# Patient Record
Sex: Female | Born: 1945 | Race: White | Hispanic: No | Marital: Married | Smoking: Never smoker
Health system: Southern US, Community
[De-identification: ages and names within clinical notes are randomized; demographics above are authoritative.]

## PROBLEM LIST (undated history)

## (undated) DIAGNOSIS — I10 Essential (primary) hypertension: Secondary | ICD-10-CM

## (undated) DIAGNOSIS — M8580 Other specified disorders of bone density and structure, unspecified site: Secondary | ICD-10-CM

## (undated) HISTORY — DX: Other specified disorders of bone density and structure, unspecified site: M85.80

---

## 1999-09-20 ENCOUNTER — Other Ambulatory Visit: Admission: RE | Admit: 1999-09-20 | Discharge: 1999-09-20 | Payer: Self-pay | Admitting: Obstetrics & Gynecology

## 2000-11-24 ENCOUNTER — Other Ambulatory Visit: Admission: RE | Admit: 2000-11-24 | Discharge: 2000-11-24 | Payer: Self-pay | Admitting: Obstetrics & Gynecology

## 2003-01-24 ENCOUNTER — Other Ambulatory Visit: Admission: RE | Admit: 2003-01-24 | Discharge: 2003-01-24 | Payer: Self-pay | Admitting: Obstetrics & Gynecology

## 2004-04-26 ENCOUNTER — Other Ambulatory Visit: Admission: RE | Admit: 2004-04-26 | Discharge: 2004-04-26 | Payer: Self-pay | Admitting: Obstetrics & Gynecology

## 2005-09-07 ENCOUNTER — Other Ambulatory Visit: Admission: RE | Admit: 2005-09-07 | Discharge: 2005-09-07 | Payer: Self-pay | Admitting: Obstetrics & Gynecology

## 2010-08-10 ENCOUNTER — Other Ambulatory Visit: Payer: Self-pay | Admitting: Obstetrics & Gynecology

## 2010-08-10 DIAGNOSIS — Z780 Asymptomatic menopausal state: Secondary | ICD-10-CM

## 2010-08-24 ENCOUNTER — Ambulatory Visit
Admission: RE | Admit: 2010-08-24 | Discharge: 2010-08-24 | Disposition: A | Discharge status: Home / Self Care | Payer: Medicare Other | Source: Ambulatory Visit | Attending: Obstetrics & Gynecology | Admitting: Obstetrics & Gynecology

## 2010-08-24 DIAGNOSIS — Z780 Asymptomatic menopausal state: Secondary | ICD-10-CM

## 2011-08-17 ENCOUNTER — Ambulatory Visit (INDEPENDENT_AMBULATORY_CARE_PROVIDER_SITE_OTHER): Payer: Medicare Other | Admitting: Physician Assistant

## 2011-08-17 VITALS — BP 148/94 | HR 64 | Temp 98.2°F | Resp 18 | Ht 65.5 in | Wt 151.0 lb

## 2011-08-17 DIAGNOSIS — Z111 Encounter for screening for respiratory tuberculosis: Secondary | ICD-10-CM

## 2011-08-17 DIAGNOSIS — J019 Acute sinusitis, unspecified: Secondary | ICD-10-CM

## 2011-08-17 DIAGNOSIS — M8580 Other specified disorders of bone density and structure, unspecified site: Secondary | ICD-10-CM | POA: Insufficient documentation

## 2011-08-17 DIAGNOSIS — R030 Elevated blood-pressure reading, without diagnosis of hypertension: Secondary | ICD-10-CM

## 2011-08-17 DIAGNOSIS — R05 Cough: Secondary | ICD-10-CM

## 2011-08-17 MED ORDER — CEFDINIR 300 MG PO CAPS: 600.0000 mg | ORAL_CAPSULE | Freq: Every day | ORAL | Status: AC

## 2011-08-17 MED ORDER — IPRATROPIUM BROMIDE 0.03 % NA SOLN: 2.0000 | Freq: Two times a day (BID) | NASAL | Status: DC

## 2011-08-17 NOTE — Patient Instructions (Signed)
Take medications as prescribed.   Drink plenty of liquids and get more rest, as you are able. Continue guaifenisen (Mucinex) and use ibuprofen &/or tylenol as needed.  Monitor your blood pressure at home weekly.  If it is consistently >140/90, return for re-evaluation and consideration of medication to lower it.  Return in 48-72 hours for the skin test reading.

## 2011-08-17 NOTE — Progress Notes (Signed)
  Subjective:    Patient ID: Michelle Jacobson, female    DOB: 08-14-1945, 66 y.o.   MRN: [id number]  URI  This is a new problem. The current episode started more than 1 month ago. The problem has been unchanged. There has been no fever. Associated symptoms include congestion, coughing, headaches, rhinorrhea, sinus pain, sneezing and a sore throat. Pertinent negatives include no abdominal pain, chest pain, diarrhea, dysuria, ear pain, joint pain, joint swelling, nausea, neck pain, plugged ear sensation, rash, swollen glands, vomiting or wheezing. She has tried acetaminophen, antihistamine, decongestant and NSAIDs (and neti pot, saline nasal spray) for the symptoms. The treatment provided moderate (relief is temporary) relief.      Review of Systems  HENT: Positive for congestion, sore throat, rhinorrhea and sneezing. Negative for ear pain and neck pain.   Respiratory: Positive for cough. Negative for wheezing.   Cardiovascular: Negative for chest pain.  Gastrointestinal: Negative for nausea, vomiting, abdominal pain and diarrhea.  Genitourinary: Negative for dysuria.  Musculoskeletal: Negative for joint pain.  Skin: Negative for rash.  Neurological: Positive for headaches.  All other systems reviewed and are negative.       Objective:   Physical Exam  Constitutional: She appears well-developed and well-nourished. No distress.  HENT:  Head: Normocephalic and atraumatic.  Right Ear: Hearing, tympanic membrane, external ear and ear canal normal.  Left Ear: Hearing, tympanic membrane, external ear and ear canal normal.  Nose: Nose normal. Right sinus exhibits no maxillary sinus tenderness and no frontal sinus tenderness. Left sinus exhibits no maxillary sinus tenderness and no frontal sinus tenderness.  Mouth/Throat: Uvula is midline, oropharynx is clear and moist and mucous membranes are normal. Normal dentition. No dental abscesses, uvula swelling or dental caries.  Eyes: Conjunctivae and  EOM are normal. Pupils are equal, round, and reactive to light. Right eye exhibits no discharge. Left eye exhibits no discharge. No scleral icterus.  Neck: Normal range of motion. Neck supple. No thyromegaly present.  Cardiovascular: Normal rate, regular rhythm and normal heart sounds.  Exam reveals no gallop and no friction rub.   No murmur heard. Pulmonary/Chest: Effort normal and breath sounds normal.  Lymphadenopathy:    She has no cervical adenopathy.  Skin: Skin is warm and dry. She is not diaphoretic.          Assessment & Plan:  Cough; Sinusitis Take medications as prescribed.   Drink plenty of liquids and get more rest, as you are able. Continue guaifenisen (Mucinex) and use ibuprofen &/or tylenol as needed.  Elevated BP Monitor your blood pressure at home weekly.  If it is consistently >140/90, return for re-evaluation and consideration of medication to lower it.  TB Screening Return in 48-72 hours for the skin test reading.

## 2011-08-19 ENCOUNTER — Ambulatory Visit (INDEPENDENT_AMBULATORY_CARE_PROVIDER_SITE_OTHER): Payer: Medicare Other | Admitting: Family Medicine

## 2011-08-19 DIAGNOSIS — Z111 Encounter for screening for respiratory tuberculosis: Secondary | ICD-10-CM

## 2011-08-19 LAB — TB SKIN TEST: TB Skin Test: NEGATIVE mm

## 2011-10-07 ENCOUNTER — Ambulatory Visit (INDEPENDENT_AMBULATORY_CARE_PROVIDER_SITE_OTHER): Payer: Medicare Other | Admitting: Family Medicine

## 2011-10-07 ENCOUNTER — Ambulatory Visit: Payer: Medicare Other

## 2011-10-07 VITALS — BP 168/83 | HR 71 | Temp 98.2°F | Resp 16 | Ht 63.5 in | Wt 150.0 lb

## 2011-10-07 DIAGNOSIS — M25561 Pain in right knee: Secondary | ICD-10-CM

## 2011-10-07 DIAGNOSIS — IMO0002 Reserved for concepts with insufficient information to code with codable children: Secondary | ICD-10-CM

## 2011-10-07 DIAGNOSIS — M25569 Pain in unspecified knee: Secondary | ICD-10-CM

## 2011-10-07 DIAGNOSIS — S83419A Sprain of medial collateral ligament of unspecified knee, initial encounter: Secondary | ICD-10-CM

## 2011-10-07 NOTE — Progress Notes (Signed)
[facility]:  Office Visit  Chief Complaint:  Chief Complaint  Patient presents with  . Knee Pain    R knee pain, hurts along side and back of knee.      HPI: Michelle Jacobson is a 66 y.o. female who complains of 1 day  Acute onset of right knee pain, intermittent, dull ache, sometimes sharp 5/10, sensation of knee giving out while walking up stairs. She was not able to put weight on knee. She took 2 advils, ICE, elevate it and today it seems better. She had a feeling of instibaility several weeks while walking her dog. No prior h/o trauma to this knee. Denies swelling.   Past Medical History  Diagnosis Date  . Osteopenia    No past surgical history on file. History   Social History  . Marital Status: Married    Spouse Name: N/A    Number of Children: N/A  . Years of Education: N/A   Social History Main Topics  . Smoking status: Never Smoker   . Smokeless tobacco: None  . Alcohol Use: None  . Drug Use: None  . Sexually Active: None   Other Topics Concern  . None   Social History Narrative  . None   Family History  Problem Relation Age of Onset  . Stroke Mother     in 88's  . Heart disease Father     rheumatic heart disease   No Known Allergies Prior to Admission medications   Medication Sig Start Date End Date Taking? Authorizing Provider  cholecalciferol (VITAMIN D) 1000 UNITS tablet Take 1,000 Units by mouth daily.   Yes Historical Provider, MD  ipratropium (ATROVENT) 0.03 % nasal spray Place 2 sprays into the nose 2 (two) times daily. 08/17/11 08/16/12  Chelle S Jeffery, PA-C  pseudoephedrine (SUDAFED) 120 MG 12 hr tablet Take 120 mg by mouth every 12 (twelve) hours.    Historical Provider, MD     ROS: The patient denies fevers, chills, night sweats, unintentional weight loss, chest pain, palpitations, wheezing, dyspnea on exertion, nausea, vomiting, abdominal pain, dysuria, hematuria, melena, numbness, weakness, or tingling. + right knee  pain  All other systems have been reviewed and were otherwise negative with the exception of those mentioned in the HPI and as above.    PHYSICAL EXAM: Filed Vitals:   10/07/11 1051  BP: 168/83  Pulse: 71  Temp: 98.2 F (36.8 C)  Resp: 16   Filed Vitals:   10/07/11 1051  Height: 5' 3.5" (1.613 m)  Weight: 150 lb (68.04 kg)   Body mass index is 26.15 kg/(m^2).  General: Alert, no acute distress HEENT:  Normocephalic, atraumatic, oropharynx patent.  Cardiovascular:  Regular rate and rhythm, no rubs murmurs or gallops.  No Carotid bruits, radial pulse intact. No pedal edema.  Respiratory: Clear to auscultation bilaterally.  No wheezes, rales, or rhonchi.  No cyanosis, no use of accessory musculature GI: No organomegaly, abdomen is soft and non-tender, positive bowel sounds.  No masses. Skin: No rashes. Neurologic: Facial musculature symmetric. Psychiatric: Patient is appropriate throughout our interaction. Lymphatic: No cervical lymphadenopathy Musculoskeletal: Gait intact. Right knee-no effusion, + crepitus, negative for McMurray or Lachmans, no jt line tenderness.+ tender at LCL and varus maneuver, 2/2 knee DTR, + 5/5 strength, sensation intact   LABS:    EKG/XRAY:   Primary read interpreted by Dr. Conley Rolls at [facility]. No fx or dislocation right knee   ASSESSMENT/PLAN: Encounter Diagnoses  Name Primary?  . Knee pain,  right Yes  . Knee sprain and strain    ? LCL sprain less likely MCL related Rx: knee brace, Motrin 400-800 mg q 8 hrs prn.  C/w RICE and ROM exercises.  F/u prn    Luverne Farone PHUONG, DO 10/07/2011 12:28 PM

## 2012-04-26 ENCOUNTER — Other Ambulatory Visit: Payer: Self-pay | Admitting: Internal Medicine

## 2012-04-26 DIAGNOSIS — J320 Chronic maxillary sinusitis: Secondary | ICD-10-CM

## 2012-04-27 ENCOUNTER — Ambulatory Visit
Admission: RE | Admit: 2012-04-27 | Discharge: 2012-04-27 | Disposition: A | Discharge status: Home / Self Care | Payer: Medicare Other | Source: Ambulatory Visit | Attending: Internal Medicine | Admitting: Internal Medicine

## 2012-04-27 DIAGNOSIS — J320 Chronic maxillary sinusitis: Secondary | ICD-10-CM

## 2014-02-18 ENCOUNTER — Other Ambulatory Visit: Payer: Self-pay | Admitting: Obstetrics & Gynecology

## 2014-02-18 DIAGNOSIS — E2839 Other primary ovarian failure: Secondary | ICD-10-CM

## 2014-02-28 ENCOUNTER — Encounter (INDEPENDENT_AMBULATORY_CARE_PROVIDER_SITE_OTHER): Payer: Self-pay

## 2014-02-28 ENCOUNTER — Ambulatory Visit
Admission: RE | Admit: 2014-02-28 | Discharge: 2014-02-28 | Disposition: A | Discharge status: Home / Self Care | Payer: Medicare Other | Source: Ambulatory Visit | Attending: Obstetrics & Gynecology | Admitting: Obstetrics & Gynecology

## 2014-02-28 DIAGNOSIS — E2839 Other primary ovarian failure: Secondary | ICD-10-CM

## 2015-12-30 DIAGNOSIS — H40013 Open angle with borderline findings, low risk, bilateral: Secondary | ICD-10-CM | POA: Diagnosis not present

## 2015-12-30 DIAGNOSIS — H04123 Dry eye syndrome of bilateral lacrimal glands: Secondary | ICD-10-CM | POA: Diagnosis not present

## 2015-12-30 DIAGNOSIS — H2513 Age-related nuclear cataract, bilateral: Secondary | ICD-10-CM | POA: Diagnosis not present

## 2016-03-11 DIAGNOSIS — E559 Vitamin D deficiency, unspecified: Secondary | ICD-10-CM | POA: Diagnosis not present

## 2016-03-11 DIAGNOSIS — Z1389 Encounter for screening for other disorder: Secondary | ICD-10-CM | POA: Diagnosis not present

## 2016-03-11 DIAGNOSIS — E663 Overweight: Secondary | ICD-10-CM | POA: Diagnosis not present

## 2016-03-11 DIAGNOSIS — J3089 Other allergic rhinitis: Secondary | ICD-10-CM | POA: Diagnosis not present

## 2016-03-11 DIAGNOSIS — R030 Elevated blood-pressure reading, without diagnosis of hypertension: Secondary | ICD-10-CM | POA: Diagnosis not present

## 2016-03-11 DIAGNOSIS — Z6827 Body mass index (BMI) 27.0-27.9, adult: Secondary | ICD-10-CM | POA: Diagnosis not present

## 2016-03-11 DIAGNOSIS — M810 Age-related osteoporosis without current pathological fracture: Secondary | ICD-10-CM | POA: Diagnosis not present

## 2016-03-11 DIAGNOSIS — E784 Other hyperlipidemia: Secondary | ICD-10-CM | POA: Diagnosis not present

## 2016-03-15 DIAGNOSIS — L821 Other seborrheic keratosis: Secondary | ICD-10-CM | POA: Diagnosis not present

## 2016-03-15 DIAGNOSIS — D1801 Hemangioma of skin and subcutaneous tissue: Secondary | ICD-10-CM | POA: Diagnosis not present

## 2016-03-15 DIAGNOSIS — D225 Melanocytic nevi of trunk: Secondary | ICD-10-CM | POA: Diagnosis not present

## 2016-07-15 DIAGNOSIS — M25572 Pain in left ankle and joints of left foot: Secondary | ICD-10-CM | POA: Diagnosis not present

## 2016-08-02 DIAGNOSIS — M25572 Pain in left ankle and joints of left foot: Secondary | ICD-10-CM | POA: Diagnosis not present

## 2016-09-06 DIAGNOSIS — M25572 Pain in left ankle and joints of left foot: Secondary | ICD-10-CM | POA: Diagnosis not present

## 2016-09-13 DIAGNOSIS — E784 Other hyperlipidemia: Secondary | ICD-10-CM | POA: Diagnosis not present

## 2016-09-13 DIAGNOSIS — Z0000 Encounter for general adult medical examination without abnormal findings: Secondary | ICD-10-CM | POA: Diagnosis not present

## 2016-09-13 DIAGNOSIS — E559 Vitamin D deficiency, unspecified: Secondary | ICD-10-CM | POA: Diagnosis not present

## 2016-09-20 DIAGNOSIS — J309 Allergic rhinitis, unspecified: Secondary | ICD-10-CM | POA: Diagnosis not present

## 2016-09-20 DIAGNOSIS — M810 Age-related osteoporosis without current pathological fracture: Secondary | ICD-10-CM | POA: Diagnosis not present

## 2016-09-20 DIAGNOSIS — M79673 Pain in unspecified foot: Secondary | ICD-10-CM | POA: Diagnosis not present

## 2016-09-20 DIAGNOSIS — Z6827 Body mass index (BMI) 27.0-27.9, adult: Secondary | ICD-10-CM | POA: Diagnosis not present

## 2016-09-20 DIAGNOSIS — Z1389 Encounter for screening for other disorder: Secondary | ICD-10-CM | POA: Diagnosis not present

## 2016-09-20 DIAGNOSIS — R030 Elevated blood-pressure reading, without diagnosis of hypertension: Secondary | ICD-10-CM | POA: Diagnosis not present

## 2016-09-20 DIAGNOSIS — Z0000 Encounter for general adult medical examination without abnormal findings: Secondary | ICD-10-CM | POA: Diagnosis not present

## 2016-09-20 DIAGNOSIS — E559 Vitamin D deficiency, unspecified: Secondary | ICD-10-CM | POA: Diagnosis not present

## 2016-09-20 DIAGNOSIS — E785 Hyperlipidemia, unspecified: Secondary | ICD-10-CM | POA: Diagnosis not present

## 2016-09-20 DIAGNOSIS — E663 Overweight: Secondary | ICD-10-CM | POA: Diagnosis not present

## 2016-09-29 DIAGNOSIS — Z1212 Encounter for screening for malignant neoplasm of rectum: Secondary | ICD-10-CM | POA: Diagnosis not present

## 2016-10-04 DIAGNOSIS — S92355A Nondisplaced fracture of fifth metatarsal bone, left foot, initial encounter for closed fracture: Secondary | ICD-10-CM | POA: Diagnosis not present

## 2016-10-06 DIAGNOSIS — Z1212 Encounter for screening for malignant neoplasm of rectum: Secondary | ICD-10-CM | POA: Diagnosis not present

## 2016-10-06 DIAGNOSIS — Z1211 Encounter for screening for malignant neoplasm of colon: Secondary | ICD-10-CM | POA: Diagnosis not present

## 2016-10-20 DIAGNOSIS — Z6827 Body mass index (BMI) 27.0-27.9, adult: Secondary | ICD-10-CM | POA: Diagnosis not present

## 2016-10-20 DIAGNOSIS — Z1231 Encounter for screening mammogram for malignant neoplasm of breast: Secondary | ICD-10-CM | POA: Diagnosis not present

## 2016-10-20 DIAGNOSIS — Z01419 Encounter for gynecological examination (general) (routine) without abnormal findings: Secondary | ICD-10-CM | POA: Diagnosis not present

## 2017-02-10 DIAGNOSIS — H04123 Dry eye syndrome of bilateral lacrimal glands: Secondary | ICD-10-CM | POA: Diagnosis not present

## 2017-02-10 DIAGNOSIS — H5703 Miosis: Secondary | ICD-10-CM | POA: Diagnosis not present

## 2017-02-10 DIAGNOSIS — H2513 Age-related nuclear cataract, bilateral: Secondary | ICD-10-CM | POA: Diagnosis not present

## 2017-02-10 DIAGNOSIS — H40013 Open angle with borderline findings, low risk, bilateral: Secondary | ICD-10-CM | POA: Diagnosis not present

## 2017-03-15 DIAGNOSIS — D225 Melanocytic nevi of trunk: Secondary | ICD-10-CM | POA: Diagnosis not present

## 2017-03-15 DIAGNOSIS — L738 Other specified follicular disorders: Secondary | ICD-10-CM | POA: Diagnosis not present

## 2017-03-15 DIAGNOSIS — L821 Other seborrheic keratosis: Secondary | ICD-10-CM | POA: Diagnosis not present

## 2017-03-15 DIAGNOSIS — D2271 Melanocytic nevi of right lower limb, including hip: Secondary | ICD-10-CM | POA: Diagnosis not present

## 2017-03-15 DIAGNOSIS — L814 Other melanin hyperpigmentation: Secondary | ICD-10-CM | POA: Diagnosis not present

## 2017-03-15 DIAGNOSIS — D1801 Hemangioma of skin and subcutaneous tissue: Secondary | ICD-10-CM | POA: Diagnosis not present

## 2017-06-12 DIAGNOSIS — Z23 Encounter for immunization: Secondary | ICD-10-CM | POA: Diagnosis not present

## 2017-06-12 DIAGNOSIS — I10 Essential (primary) hypertension: Secondary | ICD-10-CM | POA: Diagnosis not present

## 2017-06-12 DIAGNOSIS — Z6828 Body mass index (BMI) 28.0-28.9, adult: Secondary | ICD-10-CM | POA: Diagnosis not present

## 2017-06-12 DIAGNOSIS — E663 Overweight: Secondary | ICD-10-CM | POA: Diagnosis not present

## 2017-07-25 DIAGNOSIS — I10 Essential (primary) hypertension: Secondary | ICD-10-CM | POA: Diagnosis not present

## 2017-07-25 DIAGNOSIS — Z6829 Body mass index (BMI) 29.0-29.9, adult: Secondary | ICD-10-CM | POA: Diagnosis not present

## 2017-10-17 DIAGNOSIS — E559 Vitamin D deficiency, unspecified: Secondary | ICD-10-CM | POA: Diagnosis not present

## 2017-10-17 DIAGNOSIS — E7849 Other hyperlipidemia: Secondary | ICD-10-CM | POA: Diagnosis not present

## 2017-10-17 DIAGNOSIS — I10 Essential (primary) hypertension: Secondary | ICD-10-CM | POA: Diagnosis not present

## 2017-10-24 DIAGNOSIS — I10 Essential (primary) hypertension: Secondary | ICD-10-CM | POA: Diagnosis not present

## 2017-10-24 DIAGNOSIS — E7849 Other hyperlipidemia: Secondary | ICD-10-CM | POA: Diagnosis not present

## 2017-10-24 DIAGNOSIS — Z1389 Encounter for screening for other disorder: Secondary | ICD-10-CM | POA: Diagnosis not present

## 2017-10-24 DIAGNOSIS — E663 Overweight: Secondary | ICD-10-CM | POA: Diagnosis not present

## 2017-10-24 DIAGNOSIS — Z6829 Body mass index (BMI) 29.0-29.9, adult: Secondary | ICD-10-CM | POA: Diagnosis not present

## 2017-10-24 DIAGNOSIS — J3089 Other allergic rhinitis: Secondary | ICD-10-CM | POA: Diagnosis not present

## 2017-10-24 DIAGNOSIS — M810 Age-related osteoporosis without current pathological fracture: Secondary | ICD-10-CM | POA: Diagnosis not present

## 2017-10-24 DIAGNOSIS — E559 Vitamin D deficiency, unspecified: Secondary | ICD-10-CM | POA: Diagnosis not present

## 2017-10-24 DIAGNOSIS — Z0000 Encounter for general adult medical examination without abnormal findings: Secondary | ICD-10-CM | POA: Diagnosis not present

## 2018-02-13 DIAGNOSIS — H25813 Combined forms of age-related cataract, bilateral: Secondary | ICD-10-CM | POA: Diagnosis not present

## 2018-02-13 DIAGNOSIS — H40011 Open angle with borderline findings, low risk, right eye: Secondary | ICD-10-CM | POA: Diagnosis not present

## 2018-02-13 DIAGNOSIS — H401123 Primary open-angle glaucoma, left eye, severe stage: Secondary | ICD-10-CM | POA: Diagnosis not present

## 2018-03-16 DIAGNOSIS — L738 Other specified follicular disorders: Secondary | ICD-10-CM | POA: Diagnosis not present

## 2018-03-16 DIAGNOSIS — L821 Other seborrheic keratosis: Secondary | ICD-10-CM | POA: Diagnosis not present

## 2018-03-16 DIAGNOSIS — D1801 Hemangioma of skin and subcutaneous tissue: Secondary | ICD-10-CM | POA: Diagnosis not present

## 2018-03-16 DIAGNOSIS — L820 Inflamed seborrheic keratosis: Secondary | ICD-10-CM | POA: Diagnosis not present

## 2018-03-16 DIAGNOSIS — D485 Neoplasm of uncertain behavior of skin: Secondary | ICD-10-CM | POA: Diagnosis not present

## 2018-04-25 DIAGNOSIS — H401111 Primary open-angle glaucoma, right eye, mild stage: Secondary | ICD-10-CM | POA: Diagnosis not present

## 2018-04-25 DIAGNOSIS — H401123 Primary open-angle glaucoma, left eye, severe stage: Secondary | ICD-10-CM | POA: Diagnosis not present

## 2018-05-01 DIAGNOSIS — M533 Sacrococcygeal disorders, not elsewhere classified: Secondary | ICD-10-CM | POA: Diagnosis not present

## 2018-05-01 DIAGNOSIS — E559 Vitamin D deficiency, unspecified: Secondary | ICD-10-CM | POA: Diagnosis not present

## 2018-05-01 DIAGNOSIS — I10 Essential (primary) hypertension: Secondary | ICD-10-CM | POA: Diagnosis not present

## 2018-05-01 DIAGNOSIS — M810 Age-related osteoporosis without current pathological fracture: Secondary | ICD-10-CM | POA: Diagnosis not present

## 2018-05-01 DIAGNOSIS — Z6827 Body mass index (BMI) 27.0-27.9, adult: Secondary | ICD-10-CM | POA: Diagnosis not present

## 2019-03-14 DIAGNOSIS — H25813 Combined forms of age-related cataract, bilateral: Secondary | ICD-10-CM | POA: Diagnosis not present

## 2019-03-14 DIAGNOSIS — H401123 Primary open-angle glaucoma, left eye, severe stage: Secondary | ICD-10-CM | POA: Diagnosis not present

## 2019-03-14 DIAGNOSIS — H401111 Primary open-angle glaucoma, right eye, mild stage: Secondary | ICD-10-CM | POA: Diagnosis not present

## 2019-04-12 DIAGNOSIS — E559 Vitamin D deficiency, unspecified: Secondary | ICD-10-CM | POA: Diagnosis not present

## 2019-04-12 DIAGNOSIS — E7849 Other hyperlipidemia: Secondary | ICD-10-CM | POA: Diagnosis not present

## 2019-04-19 DIAGNOSIS — Z20828 Contact with and (suspected) exposure to other viral communicable diseases: Secondary | ICD-10-CM | POA: Diagnosis not present

## 2019-04-19 DIAGNOSIS — E663 Overweight: Secondary | ICD-10-CM | POA: Diagnosis not present

## 2019-04-19 DIAGNOSIS — Z23 Encounter for immunization: Secondary | ICD-10-CM | POA: Diagnosis not present

## 2019-04-19 DIAGNOSIS — E785 Hyperlipidemia, unspecified: Secondary | ICD-10-CM | POA: Diagnosis not present

## 2019-04-19 DIAGNOSIS — R82998 Other abnormal findings in urine: Secondary | ICD-10-CM | POA: Diagnosis not present

## 2019-04-19 DIAGNOSIS — J309 Allergic rhinitis, unspecified: Secondary | ICD-10-CM | POA: Diagnosis not present

## 2019-04-19 DIAGNOSIS — Z0000 Encounter for general adult medical examination without abnormal findings: Secondary | ICD-10-CM | POA: Diagnosis not present

## 2019-04-19 DIAGNOSIS — M810 Age-related osteoporosis without current pathological fracture: Secondary | ICD-10-CM | POA: Diagnosis not present

## 2019-04-19 DIAGNOSIS — E559 Vitamin D deficiency, unspecified: Secondary | ICD-10-CM | POA: Diagnosis not present

## 2019-04-19 DIAGNOSIS — I10 Essential (primary) hypertension: Secondary | ICD-10-CM | POA: Diagnosis not present

## 2019-04-19 DIAGNOSIS — H409 Unspecified glaucoma: Secondary | ICD-10-CM | POA: Diagnosis not present

## 2019-07-17 DIAGNOSIS — Z1212 Encounter for screening for malignant neoplasm of rectum: Secondary | ICD-10-CM | POA: Diagnosis not present

## 2019-10-31 DIAGNOSIS — Z23 Encounter for immunization: Secondary | ICD-10-CM | POA: Diagnosis not present

## 2019-11-04 DIAGNOSIS — Z23 Encounter for immunization: Secondary | ICD-10-CM | POA: Diagnosis not present

## 2019-11-19 DIAGNOSIS — H401123 Primary open-angle glaucoma, left eye, severe stage: Secondary | ICD-10-CM | POA: Diagnosis not present

## 2019-11-19 DIAGNOSIS — H25813 Combined forms of age-related cataract, bilateral: Secondary | ICD-10-CM | POA: Diagnosis not present

## 2019-11-19 DIAGNOSIS — H401111 Primary open-angle glaucoma, right eye, mild stage: Secondary | ICD-10-CM | POA: Diagnosis not present

## 2020-04-13 DIAGNOSIS — I10 Essential (primary) hypertension: Secondary | ICD-10-CM | POA: Diagnosis not present

## 2020-04-13 DIAGNOSIS — E785 Hyperlipidemia, unspecified: Secondary | ICD-10-CM | POA: Diagnosis not present

## 2020-04-13 DIAGNOSIS — E559 Vitamin D deficiency, unspecified: Secondary | ICD-10-CM | POA: Diagnosis not present

## 2020-04-20 DIAGNOSIS — Z0000 Encounter for general adult medical examination without abnormal findings: Secondary | ICD-10-CM | POA: Diagnosis not present

## 2020-04-20 DIAGNOSIS — I10 Essential (primary) hypertension: Secondary | ICD-10-CM | POA: Diagnosis not present

## 2020-04-20 DIAGNOSIS — H409 Unspecified glaucoma: Secondary | ICD-10-CM | POA: Diagnosis not present

## 2020-04-20 DIAGNOSIS — R82998 Other abnormal findings in urine: Secondary | ICD-10-CM | POA: Diagnosis not present

## 2020-04-20 DIAGNOSIS — Z23 Encounter for immunization: Secondary | ICD-10-CM | POA: Diagnosis not present

## 2020-04-20 DIAGNOSIS — E785 Hyperlipidemia, unspecified: Secondary | ICD-10-CM | POA: Diagnosis not present

## 2020-04-20 DIAGNOSIS — E663 Overweight: Secondary | ICD-10-CM | POA: Diagnosis not present

## 2020-04-20 DIAGNOSIS — J309 Allergic rhinitis, unspecified: Secondary | ICD-10-CM | POA: Diagnosis not present

## 2020-04-20 DIAGNOSIS — E559 Vitamin D deficiency, unspecified: Secondary | ICD-10-CM | POA: Diagnosis not present

## 2020-04-20 DIAGNOSIS — M810 Age-related osteoporosis without current pathological fracture: Secondary | ICD-10-CM | POA: Diagnosis not present

## 2020-05-19 DIAGNOSIS — H401111 Primary open-angle glaucoma, right eye, mild stage: Secondary | ICD-10-CM | POA: Diagnosis not present

## 2020-05-19 DIAGNOSIS — H401123 Primary open-angle glaucoma, left eye, severe stage: Secondary | ICD-10-CM | POA: Diagnosis not present

## 2020-12-10 DIAGNOSIS — H401111 Primary open-angle glaucoma, right eye, mild stage: Secondary | ICD-10-CM | POA: Diagnosis not present

## 2020-12-10 DIAGNOSIS — H401123 Primary open-angle glaucoma, left eye, severe stage: Secondary | ICD-10-CM | POA: Diagnosis not present

## 2020-12-10 DIAGNOSIS — H47392 Other disorders of optic disc, left eye: Secondary | ICD-10-CM | POA: Diagnosis not present

## 2020-12-10 DIAGNOSIS — H25813 Combined forms of age-related cataract, bilateral: Secondary | ICD-10-CM | POA: Diagnosis not present

## 2021-03-16 DIAGNOSIS — H401123 Primary open-angle glaucoma, left eye, severe stage: Secondary | ICD-10-CM | POA: Diagnosis not present

## 2021-03-16 DIAGNOSIS — H401111 Primary open-angle glaucoma, right eye, mild stage: Secondary | ICD-10-CM | POA: Diagnosis not present

## 2021-03-16 DIAGNOSIS — H47392 Other disorders of optic disc, left eye: Secondary | ICD-10-CM | POA: Diagnosis not present

## 2021-03-16 DIAGNOSIS — H25813 Combined forms of age-related cataract, bilateral: Secondary | ICD-10-CM | POA: Diagnosis not present

## 2021-04-30 DIAGNOSIS — I10 Essential (primary) hypertension: Secondary | ICD-10-CM | POA: Diagnosis not present

## 2021-04-30 DIAGNOSIS — E785 Hyperlipidemia, unspecified: Secondary | ICD-10-CM | POA: Diagnosis not present

## 2021-04-30 DIAGNOSIS — E559 Vitamin D deficiency, unspecified: Secondary | ICD-10-CM | POA: Diagnosis not present

## 2021-05-07 DIAGNOSIS — Z1339 Encounter for screening examination for other mental health and behavioral disorders: Secondary | ICD-10-CM | POA: Diagnosis not present

## 2021-05-07 DIAGNOSIS — M1990 Unspecified osteoarthritis, unspecified site: Secondary | ICD-10-CM | POA: Diagnosis not present

## 2021-05-07 DIAGNOSIS — Z0000 Encounter for general adult medical examination without abnormal findings: Secondary | ICD-10-CM | POA: Diagnosis not present

## 2021-05-07 DIAGNOSIS — M810 Age-related osteoporosis without current pathological fracture: Secondary | ICD-10-CM | POA: Diagnosis not present

## 2021-05-07 DIAGNOSIS — E559 Vitamin D deficiency, unspecified: Secondary | ICD-10-CM | POA: Diagnosis not present

## 2021-05-07 DIAGNOSIS — I10 Essential (primary) hypertension: Secondary | ICD-10-CM | POA: Diagnosis not present

## 2021-05-07 DIAGNOSIS — E785 Hyperlipidemia, unspecified: Secondary | ICD-10-CM | POA: Diagnosis not present

## 2021-05-07 DIAGNOSIS — Z1212 Encounter for screening for malignant neoplasm of rectum: Secondary | ICD-10-CM | POA: Diagnosis not present

## 2021-05-07 DIAGNOSIS — Z23 Encounter for immunization: Secondary | ICD-10-CM | POA: Diagnosis not present

## 2021-05-07 DIAGNOSIS — R82998 Other abnormal findings in urine: Secondary | ICD-10-CM | POA: Diagnosis not present

## 2021-05-07 DIAGNOSIS — Z1331 Encounter for screening for depression: Secondary | ICD-10-CM | POA: Diagnosis not present

## 2021-05-07 DIAGNOSIS — E663 Overweight: Secondary | ICD-10-CM | POA: Diagnosis not present

## 2021-05-11 ENCOUNTER — Other Ambulatory Visit: Payer: Self-pay | Admitting: Internal Medicine

## 2021-05-11 DIAGNOSIS — Z1231 Encounter for screening mammogram for malignant neoplasm of breast: Secondary | ICD-10-CM

## 2021-05-28 ENCOUNTER — Ambulatory Visit
Admission: RE | Admit: 2021-05-28 | Discharge: 2021-05-28 | Disposition: A | Discharge status: Home / Self Care | Payer: PRIVATE HEALTH INSURANCE | Source: Ambulatory Visit | Attending: Internal Medicine | Admitting: Internal Medicine

## 2021-05-28 ENCOUNTER — Other Ambulatory Visit: Payer: Self-pay

## 2021-05-28 DIAGNOSIS — Z1231 Encounter for screening mammogram for malignant neoplasm of breast: Secondary | ICD-10-CM | POA: Diagnosis not present

## 2021-06-04 DIAGNOSIS — M810 Age-related osteoporosis without current pathological fracture: Secondary | ICD-10-CM | POA: Diagnosis not present

## 2021-07-27 DIAGNOSIS — H25813 Combined forms of age-related cataract, bilateral: Secondary | ICD-10-CM | POA: Diagnosis not present

## 2021-07-27 DIAGNOSIS — H401123 Primary open-angle glaucoma, left eye, severe stage: Secondary | ICD-10-CM | POA: Diagnosis not present

## 2021-07-27 DIAGNOSIS — H401111 Primary open-angle glaucoma, right eye, mild stage: Secondary | ICD-10-CM | POA: Diagnosis not present

## 2021-07-27 DIAGNOSIS — H47392 Other disorders of optic disc, left eye: Secondary | ICD-10-CM | POA: Diagnosis not present

## 2021-11-17 ENCOUNTER — Encounter (HOSPITAL_COMMUNITY): Payer: Self-pay | Admitting: *Deleted

## 2021-11-17 ENCOUNTER — Other Ambulatory Visit: Payer: Self-pay

## 2021-11-17 ENCOUNTER — Emergency Department (HOSPITAL_COMMUNITY): Payer: PPO

## 2021-11-17 ENCOUNTER — Inpatient Hospital Stay (HOSPITAL_COMMUNITY)
Admission: EM | Admit: 2021-11-17 | Discharge: 2021-11-19 | DRG: 065 | Disposition: A | Discharge status: Home / Self Care | Payer: PPO | Attending: Internal Medicine | Admitting: Internal Medicine

## 2021-11-17 DIAGNOSIS — Z20822 Contact with and (suspected) exposure to covid-19: Secondary | ICD-10-CM | POA: Diagnosis present

## 2021-11-17 DIAGNOSIS — I10 Essential (primary) hypertension: Secondary | ICD-10-CM | POA: Diagnosis not present

## 2021-11-17 DIAGNOSIS — R0602 Shortness of breath: Secondary | ICD-10-CM | POA: Diagnosis not present

## 2021-11-17 DIAGNOSIS — I6521 Occlusion and stenosis of right carotid artery: Secondary | ICD-10-CM | POA: Diagnosis not present

## 2021-11-17 DIAGNOSIS — E7800 Pure hypercholesterolemia, unspecified: Secondary | ICD-10-CM | POA: Diagnosis not present

## 2021-11-17 DIAGNOSIS — D72829 Elevated white blood cell count, unspecified: Secondary | ICD-10-CM | POA: Diagnosis present

## 2021-11-17 DIAGNOSIS — I160 Hypertensive urgency: Secondary | ICD-10-CM | POA: Diagnosis present

## 2021-11-17 DIAGNOSIS — J3489 Other specified disorders of nose and nasal sinuses: Secondary | ICD-10-CM | POA: Diagnosis not present

## 2021-11-17 DIAGNOSIS — R262 Difficulty in walking, not elsewhere classified: Secondary | ICD-10-CM | POA: Diagnosis not present

## 2021-11-17 DIAGNOSIS — I639 Cerebral infarction, unspecified: Secondary | ICD-10-CM | POA: Diagnosis not present

## 2021-11-17 DIAGNOSIS — Z8673 Personal history of transient ischemic attack (TIA), and cerebral infarction without residual deficits: Secondary | ICD-10-CM | POA: Diagnosis not present

## 2021-11-17 DIAGNOSIS — E785 Hyperlipidemia, unspecified: Secondary | ICD-10-CM | POA: Diagnosis not present

## 2021-11-17 DIAGNOSIS — Z79899 Other long term (current) drug therapy: Secondary | ICD-10-CM

## 2021-11-17 DIAGNOSIS — Z823 Family history of stroke: Secondary | ICD-10-CM | POA: Diagnosis not present

## 2021-11-17 DIAGNOSIS — R200 Anesthesia of skin: Secondary | ICD-10-CM | POA: Diagnosis not present

## 2021-11-17 DIAGNOSIS — G8194 Hemiplegia, unspecified affecting left nondominant side: Secondary | ICD-10-CM | POA: Diagnosis not present

## 2021-11-17 DIAGNOSIS — Z8249 Family history of ischemic heart disease and other diseases of the circulatory system: Secondary | ICD-10-CM | POA: Diagnosis not present

## 2021-11-17 DIAGNOSIS — I6329 Cerebral infarction due to unspecified occlusion or stenosis of other precerebral arteries: Principal | ICD-10-CM | POA: Diagnosis present

## 2021-11-17 DIAGNOSIS — R29700 NIHSS score 0: Secondary | ICD-10-CM | POA: Diagnosis present

## 2021-11-17 DIAGNOSIS — I6389 Other cerebral infarction: Secondary | ICD-10-CM | POA: Diagnosis not present

## 2021-11-17 DIAGNOSIS — R531 Weakness: Secondary | ICD-10-CM | POA: Diagnosis not present

## 2021-11-17 DIAGNOSIS — E042 Nontoxic multinodular goiter: Secondary | ICD-10-CM | POA: Diagnosis not present

## 2021-11-17 DIAGNOSIS — I6623 Occlusion and stenosis of bilateral posterior cerebral arteries: Secondary | ICD-10-CM | POA: Diagnosis not present

## 2021-11-17 HISTORY — DX: Essential (primary) hypertension: I10

## 2021-11-17 LAB — PROTIME-INR
INR: 0.9 (ref 0.8–1.2)
Prothrombin Time: 11.8 seconds (ref 11.4–15.2)

## 2021-11-17 LAB — URINALYSIS, ROUTINE W REFLEX MICROSCOPIC
Bilirubin Urine: NEGATIVE
Glucose, UA: NEGATIVE mg/dL
Hgb urine dipstick: NEGATIVE
Ketones, ur: NEGATIVE mg/dL
Nitrite: NEGATIVE
Protein, ur: NEGATIVE mg/dL
Specific Gravity, Urine: 1.018 (ref 1.005–1.030)
pH: 5 (ref 5.0–8.0)

## 2021-11-17 LAB — I-STAT CHEM 8, ED
BUN: 21 mg/dL (ref 8–23)
Calcium, Ion: 1 mmol/L — ABNORMAL LOW (ref 1.15–1.40)
Chloride: 106 mmol/L (ref 98–111)
Creatinine, Ser: 0.8 mg/dL (ref 0.44–1.00)
Glucose, Bld: 115 mg/dL — ABNORMAL HIGH (ref 70–99)
HCT: 45 % (ref 36.0–46.0)
Hemoglobin: 15.3 g/dL — ABNORMAL HIGH (ref 12.0–15.0)
Potassium: 4 mmol/L (ref 3.5–5.1)
Sodium: 137 mmol/L (ref 135–145)
TCO2: 21 mmol/L — ABNORMAL LOW (ref 22–32)

## 2021-11-17 LAB — CBC
HCT: 43.6 % (ref 36.0–46.0)
Hemoglobin: 14.6 g/dL (ref 12.0–15.0)
MCH: 30.5 pg (ref 26.0–34.0)
MCHC: 33.5 g/dL (ref 30.0–36.0)
MCV: 91.2 fL (ref 80.0–100.0)
Platelets: 310 10*3/uL (ref 150–400)
RBC: 4.78 MIL/uL (ref 3.87–5.11)
RDW: 13.1 % (ref 11.5–15.5)
WBC: 10.7 10*3/uL — ABNORMAL HIGH (ref 4.0–10.5)
nRBC: 0 % (ref 0.0–0.2)

## 2021-11-17 LAB — RESP PANEL BY RT-PCR (FLU A&B, COVID) ARPGX2
Influenza A by PCR: NEGATIVE
Influenza B by PCR: NEGATIVE
SARS Coronavirus 2 by RT PCR: NEGATIVE

## 2021-11-17 LAB — DIFFERENTIAL
Abs Immature Granulocytes: 0.03 10*3/uL (ref 0.00–0.07)
Basophils Absolute: 0.1 10*3/uL (ref 0.0–0.1)
Basophils Relative: 1 %
Eosinophils Absolute: 0.3 10*3/uL (ref 0.0–0.5)
Eosinophils Relative: 3 %
Immature Granulocytes: 0 %
Lymphocytes Relative: 16 %
Lymphs Abs: 1.7 10*3/uL (ref 0.7–4.0)
Monocytes Absolute: 1.2 10*3/uL — ABNORMAL HIGH (ref 0.1–1.0)
Monocytes Relative: 11 %
Neutro Abs: 7.4 10*3/uL (ref 1.7–7.7)
Neutrophils Relative %: 69 %

## 2021-11-17 LAB — COMPREHENSIVE METABOLIC PANEL
ALT: 16 U/L (ref 0–44)
AST: 20 U/L (ref 15–41)
Albumin: 4.1 g/dL (ref 3.5–5.0)
Alkaline Phosphatase: 56 U/L (ref 38–126)
Anion gap: 10 (ref 5–15)
BUN: 20 mg/dL (ref 8–23)
CO2: 20 mmol/L — ABNORMAL LOW (ref 22–32)
Calcium: 9.2 mg/dL (ref 8.9–10.3)
Chloride: 106 mmol/L (ref 98–111)
Creatinine, Ser: 0.92 mg/dL (ref 0.44–1.00)
GFR, Estimated: >60 mL/min (ref >60)
Glucose, Bld: 116 mg/dL — ABNORMAL HIGH (ref 70–99)
Potassium: 4 mmol/L (ref 3.5–5.1)
Sodium: 136 mmol/L (ref 135–145)
Total Bilirubin: 0.4 mg/dL (ref 0.3–1.2)
Total Protein: 6.9 g/dL (ref 6.5–8.1)

## 2021-11-17 LAB — RAPID URINE DRUG SCREEN, HOSP PERFORMED
Amphetamines: NOT DETECTED
Barbiturates: NOT DETECTED
Benzodiazepines: NOT DETECTED
Cocaine: NOT DETECTED
Opiates: NOT DETECTED
Tetrahydrocannabinol: NOT DETECTED

## 2021-11-17 LAB — APTT: aPTT: 25 seconds (ref 24–36)

## 2021-11-17 LAB — ETHANOL: Alcohol, Ethyl (B): <10 mg/dL (ref <10)

## 2021-11-17 NOTE — ED Triage Notes (Signed)
The pt has had  lt arm and lt leg numbness  since Friday n and v on Friday also   her bp was very high and she has the numbness of her lt leg and arm ?

## 2021-11-17 NOTE — ED Provider Triage Note (Signed)
Emergency Medicine Provider Triage Evaluation Note ? ?Michelle Jacobson , a 76 y.o. female  was evaluated in triage.  Pt complains of left-sided weakness and vomiting.  Patient symptoms started Friday 5 days ago, she is woken up in the middle the night with vomiting and feeling weak to the left upper and lower extremity.  Was prescribed Zofran by PCP which helped with the vomiting.  She has been having left upper extremity and left lower extremity weakness since then, does not usually needs a walker to ambulate but has bad unsteady gait since then.  Not on any blood thinners, No history of TIA or blood clots.  Feels like left upper extremity is tingling but denies any current weakness. ? ?Review of Systems  ?Per HPI ? ?Physical Exam  ?BP (!) 187/85 (BP Location: Right Arm)   Pulse 77   Temp 98.7 ?F (37.1 ?C) (Oral)   Resp 18   Ht '5\' 3"'$  (1.6 m)   Wt 68 kg   SpO2 98%   BMI 26.56 kg/m?  ?Gen:   Awake, no distress   ?Resp:  Normal effort  ?MSK:   Moves extremities without difficulty  ?Other:  No dysarthria, follows commands and oriented x3.  Cranial nerves III through XII are grossly intact, grip strength is equal bilaterally.  No pronator drift, normal finger-nose.  Upper and lower extremity strength 5/5, plantarflexion dorsiflexion normal.  Patellar reflexes intact bilaterally. ? ?Medical Decision Making  ?Medically screening exam initiated at 10:33 PM.  Appropriate orders placed.  Michelle Jacobson was informed that the remainder of the evaluation will be completed by another provider, this initial triage assessment does not replace that evaluation, and the importance of remaining in the ED until their evaluation is complete. ? ?TIA work-up, not acute stroke due to last known normal 5 days previous.  No appreciable focal deficits on exam. ?  ?Sherrill Raring, PA-C ?11/17/21 2233 ? ?

## 2021-11-18 ENCOUNTER — Inpatient Hospital Stay (HOSPITAL_COMMUNITY): Payer: PPO

## 2021-11-18 ENCOUNTER — Emergency Department (HOSPITAL_COMMUNITY): Payer: PPO

## 2021-11-18 DIAGNOSIS — E785 Hyperlipidemia, unspecified: Secondary | ICD-10-CM | POA: Diagnosis present

## 2021-11-18 DIAGNOSIS — E042 Nontoxic multinodular goiter: Secondary | ICD-10-CM | POA: Diagnosis not present

## 2021-11-18 DIAGNOSIS — Z8249 Family history of ischemic heart disease and other diseases of the circulatory system: Secondary | ICD-10-CM | POA: Diagnosis not present

## 2021-11-18 DIAGNOSIS — I639 Cerebral infarction, unspecified: Secondary | ICD-10-CM

## 2021-11-18 DIAGNOSIS — R531 Weakness: Secondary | ICD-10-CM | POA: Diagnosis not present

## 2021-11-18 DIAGNOSIS — R0602 Shortness of breath: Secondary | ICD-10-CM | POA: Diagnosis not present

## 2021-11-18 DIAGNOSIS — R262 Difficulty in walking, not elsewhere classified: Secondary | ICD-10-CM | POA: Diagnosis present

## 2021-11-18 DIAGNOSIS — D72829 Elevated white blood cell count, unspecified: Secondary | ICD-10-CM | POA: Diagnosis present

## 2021-11-18 DIAGNOSIS — E7800 Pure hypercholesterolemia, unspecified: Secondary | ICD-10-CM | POA: Diagnosis not present

## 2021-11-18 DIAGNOSIS — J3489 Other specified disorders of nose and nasal sinuses: Secondary | ICD-10-CM | POA: Diagnosis not present

## 2021-11-18 DIAGNOSIS — G8194 Hemiplegia, unspecified affecting left nondominant side: Secondary | ICD-10-CM | POA: Diagnosis present

## 2021-11-18 DIAGNOSIS — Z79899 Other long term (current) drug therapy: Secondary | ICD-10-CM | POA: Diagnosis not present

## 2021-11-18 DIAGNOSIS — Z20822 Contact with and (suspected) exposure to covid-19: Secondary | ICD-10-CM | POA: Diagnosis present

## 2021-11-18 DIAGNOSIS — I160 Hypertensive urgency: Secondary | ICD-10-CM | POA: Diagnosis present

## 2021-11-18 DIAGNOSIS — I6329 Cerebral infarction due to unspecified occlusion or stenosis of other precerebral arteries: Secondary | ICD-10-CM | POA: Diagnosis present

## 2021-11-18 DIAGNOSIS — I6389 Other cerebral infarction: Secondary | ICD-10-CM | POA: Diagnosis not present

## 2021-11-18 DIAGNOSIS — I6521 Occlusion and stenosis of right carotid artery: Secondary | ICD-10-CM | POA: Diagnosis not present

## 2021-11-18 DIAGNOSIS — R200 Anesthesia of skin: Secondary | ICD-10-CM | POA: Diagnosis not present

## 2021-11-18 DIAGNOSIS — I6623 Occlusion and stenosis of bilateral posterior cerebral arteries: Secondary | ICD-10-CM | POA: Diagnosis not present

## 2021-11-18 DIAGNOSIS — Z823 Family history of stroke: Secondary | ICD-10-CM | POA: Diagnosis not present

## 2021-11-18 DIAGNOSIS — Z8673 Personal history of transient ischemic attack (TIA), and cerebral infarction without residual deficits: Secondary | ICD-10-CM | POA: Diagnosis not present

## 2021-11-18 DIAGNOSIS — I10 Essential (primary) hypertension: Secondary | ICD-10-CM | POA: Diagnosis present

## 2021-11-18 DIAGNOSIS — R29700 NIHSS score 0: Secondary | ICD-10-CM | POA: Diagnosis present

## 2021-11-18 LAB — LIPID PANEL
Cholesterol: 238 mg/dL — ABNORMAL HIGH (ref 0–200)
HDL: 46 mg/dL (ref >40)
LDL Cholesterol: 164 mg/dL — ABNORMAL HIGH (ref 0–99)
Total CHOL/HDL Ratio: 5.2 RATIO
Triglycerides: 140 mg/dL (ref <150)
VLDL: 28 mg/dL (ref 0–40)

## 2021-11-18 LAB — HEMOGLOBIN A1C
Hgb A1c MFr Bld: 5.6 % (ref 4.8–5.6)
Mean Plasma Glucose: 114.02 mg/dL

## 2021-11-18 MED ORDER — ATORVASTATIN CALCIUM 40 MG PO TABS
40.0000 mg | ORAL_TABLET | Freq: Every day | ORAL | Status: DC
Start: 2021-11-18 — End: 2021-11-18

## 2021-11-18 MED ORDER — ACETAMINOPHEN 160 MG/5ML PO SOLN: 650.0000 mg | ORAL | Status: DC | PRN

## 2021-11-18 MED ORDER — ASPIRIN 81 MG PO CHEW
324.0000 mg | CHEWABLE_TABLET | Freq: Once | ORAL | Status: AC
  Administered 2021-11-18: 324 mg via ORAL
  Filled 2021-11-18: qty 4

## 2021-11-18 MED ORDER — LATANOPROST 0.005 % OP SOLN
1.0000 [drp] | Freq: Every day | OPHTHALMIC | Status: DC
  Filled 2021-11-18: qty 2.5

## 2021-11-18 MED ORDER — ASPIRIN EC 81 MG PO TBEC
81.0000 mg | DELAYED_RELEASE_TABLET | Freq: Every day | ORAL | Status: DC
  Administered 2021-11-19: 81 mg via ORAL
  Filled 2021-11-18: qty 1

## 2021-11-18 MED ORDER — CLOPIDOGREL BISULFATE 75 MG PO TABS
75.0000 mg | ORAL_TABLET | Freq: Every day | ORAL | Status: DC
  Administered 2021-11-19: 75 mg via ORAL
  Filled 2021-11-18: qty 1

## 2021-11-18 MED ORDER — CLOPIDOGREL BISULFATE 300 MG PO TABS
300.0000 mg | ORAL_TABLET | Freq: Once | ORAL | Status: AC
  Administered 2021-11-18: 300 mg via ORAL
  Filled 2021-11-18: qty 1

## 2021-11-18 MED ORDER — ACETAMINOPHEN 325 MG PO TABS: 650.0000 mg | ORAL_TABLET | ORAL | Status: DC | PRN

## 2021-11-18 MED ORDER — ATORVASTATIN CALCIUM 80 MG PO TABS
80.0000 mg | ORAL_TABLET | Freq: Every day | ORAL | Status: DC
  Administered 2021-11-19: 80 mg via ORAL
  Filled 2021-11-18: qty 1

## 2021-11-18 MED ORDER — IOHEXOL 350 MG/ML SOLN
75.0000 mL | Freq: Once | INTRAVENOUS | Status: AC | PRN
  Administered 2021-11-18: 75 mL via INTRAVENOUS

## 2021-11-18 MED ORDER — SODIUM CHLORIDE 0.9 % IV SOLN: Freq: Once | INTRAVENOUS | Status: AC

## 2021-11-18 MED ORDER — ATORVASTATIN CALCIUM 80 MG PO TABS
80.0000 mg | ORAL_TABLET | Freq: Every day | ORAL | Status: DC
Start: 2021-11-18 — End: 2021-11-18

## 2021-11-18 MED ORDER — ENOXAPARIN SODIUM 40 MG/0.4ML IJ SOSY
40.0000 mg | PREFILLED_SYRINGE | INTRAMUSCULAR | Status: DC
  Administered 2021-11-18: 40 mg via SUBCUTANEOUS
  Filled 2021-11-18 (×2): qty 0.4

## 2021-11-18 MED ORDER — LORAZEPAM 2 MG/ML IJ SOLN
0.5000 mg | Freq: Once | INTRAMUSCULAR | Status: AC | PRN
  Administered 2021-11-18: 0.5 mg via INTRAVENOUS
  Filled 2021-11-18: qty 1

## 2021-11-18 MED ORDER — DORZOLAMIDE HCL-TIMOLOL MAL 2-0.5 % OP SOLN
1.0000 [drp] | Freq: Two times a day (BID) | OPHTHALMIC | Status: DC
  Filled 2021-11-18: qty 10

## 2021-11-18 MED ORDER — STROKE: EARLY STAGES OF RECOVERY BOOK
Freq: Once | Status: DC
  Filled 2021-11-18: qty 1

## 2021-11-18 MED ORDER — HYDRALAZINE HCL 20 MG/ML IJ SOLN: 10.0000 mg | INTRAMUSCULAR | Status: DC | PRN

## 2021-11-18 MED ORDER — ACETAMINOPHEN 650 MG RE SUPP: 650.0000 mg | RECTAL | Status: DC | PRN

## 2021-11-18 MED ORDER — LOSARTAN POTASSIUM 50 MG PO TABS
100.0000 mg | ORAL_TABLET | Freq: Every day | ORAL | Status: DC
  Administered 2021-11-19: 100 mg via ORAL
  Filled 2021-11-18: qty 2

## 2021-11-18 NOTE — ED Notes (Signed)
Per CT tech; patient has refused to go to CT until she has her meal. Patient provided education on why the CT is important and patient still refuses.  ?

## 2021-11-18 NOTE — H&P (Signed)
?History and Physical  ? ? ?Patient: Michelle Jacobson PNT:[id number] DOB: January 26, 1946 ?DOA: 11/17/2021 ?DOS: the patient was seen and examined on 11/18/2021 ?PCP: Pcp, No  ?Patient coming from: Home ? ?Chief Complaint:  ?Chief Complaint  ?Patient presents with  ? Difficulty Walking  ? ?HPI: Michelle Jacobson is a 75 y.o. female with medical history significant of hypertension and osteopenia presents with complaints of left-sided numbness and weakness.  History is mostly obtained from review of records as the patient reports that she stated the same thing multiple times and is tired.  Symptoms started after patient reported feeling bad 6 days ago.  She had eaten pizza earlier in the day which was not out of the norm.  Complained of feeling dizzy and subsequently had nausea and vomiting.  She reported having several episodes of vomiting throughout the night.  The following morning patient noted having left upper extremity numbness and tingling as well as some left leg weakness.  He complained of having difficulty walking and keeping her balance.  Denies having any chest pain, palpitations, headache, or change in speech.  Due to persistence of symptoms she came to the hospital for further evaluation. ? ? ?On admission into the emergency department patient was seen to be afebrile with blood pressures elevated up to 207/71 and all other vital signs maintained.  Patient has been evaluated by neurology.  CT scan of the head did not note any acute abnormality.  Patient was not a candidate for thrombolytics as she was out of the window.  Labs from 5/3 significant for W 10.7.  Urinalysis did not show significant signs of infection.  Urine drug screen was negative.  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  Patient was loaded with Plavix and aspirin. ? ?Review of Systems: As mentioned in the  history of present illness. All other systems reviewed and are negative. ?Past Medical History:  ?Diagnosis Date  ? Hypertension   ? Osteopenia   ? ?History reviewed. No pertinent surgical history. ?Social History:  reports that she has never smoked. She does not have any smokeless tobacco history on file. She reports that she does not drink alcohol and does not use drugs. ? ?No Known Allergies ? ?Family History  ?Problem Relation Age of Onset  ? Stroke Mother   ?     in 15's  ? Heart disease Father   ?     rheumatic heart disease  ? Breast cancer Neg Hx   ? ? ?Prior to Admission medications   ?Medication Sig Start Date End Date Taking? Authorizing Provider  ?cholecalciferol (VITAMIN D) 1000 UNITS tablet Take 1,000 Units by mouth daily.   Yes [provider]  ?dorzolamide-timolol (COSOPT) 22.3-6.8 MG/ML ophthalmic solution Place 1 drop into the left eye 2 (two) times daily. 10/09/21  Yes [provider]  ?latanoprost (XALATAN) 0.005 % ophthalmic solution Place 1 drop into both eyes at bedtime. 09/17/21  Yes [provider]  ?losartan (COZAAR) 100 MG tablet Take 100 mg by mouth daily.   Yes [provider]  ?ondansetron (ZOFRAN) 8 MG tablet Take 8 mg by mouth every 8 (eight) hours as needed for nausea or vomiting.   Yes [provider]  ?ipratropium (ATROVENT) 0.03 % nasal spray Place 2 sprays into the nose 2 (two) times daily. 08/17/11 08/16/12  Harrison Mons, [place]  ?  pseudoephedrine (SUDAFED) 120 MG 12 hr tablet Take 120 mg by mouth every 12 (twelve) hours. ?Patient not taking: Reported on 11/18/2021    [provider]  ? ? ?Physical Exam: ?Vitals:  ? 11/18/21 1209 11/18/21 1230 11/18/21 1300 11/18/21 1511  ?BP: (!) 166/67 (!) 162/74 (!) 168/74 (!) 177/67  ?Pulse: 72 73 71 68  ?Resp: '18 16 15 17  '$ ?Temp:      ?TempSrc:      ?SpO2: 100% 100% 100% 99%  ?Weight:      ?Height:      ? ? ?Constitutional: Elderly female who appears to be NAD, calm, comfortable ?Eyes: PERRL,  lids and conjunctivae normal ?ENMT: Mucous membranes are moist.  Normal dentition.  ?Neck: normal, supple  ?Respiratory: clear to auscultation bilaterally, no wheezing, no crackles. Normal respiratory effort. No accessory muscle use.  ?Cardiovascular: Regular rate and rhythm, no murmurs / rubs / gallops. No extremity edema.   ?Abdomen: no tenderness, bowel sounds present. ?Musculoskeletal: no clubbing / cyanosis. No joint deformity upper and lower extremities.  ?Skin: no rashes, lesions, ulcers. No induration ?Neurologic: CN 2-12 grossly intact.   Strength 5/5 in all 4.  Sensation was not tested for ?Psychiatric: Normal judgment and insight. Alert and oriented x 3.  Irritated mood.  ? ?Data Reviewed: ? ?EKG revealed normal sinus rhythm at 69 bpm. ? ?Assessment and Plan: ?CVA ?Acute/subacute.  Patient presents with complaints of left-sided numbness and weakness which started 5 days ago.  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  Patient has been started on aspirin and Plavix. ?-Admit to a telemetry bed ?-Neuro check ?-Follow-up echocardiogram ?-Check CTA of the head and neck ?-Check hemoglobin A1c ?-PT/OT/speech to evaluate and treat ?-Continue aspirin and Plavix ?-Appreciate neurology consultative services, we will follow-up for further recommendation ? ?Hypertensive urgency/emergency ?Upon admission into the emergency department patient was noted to have blood pressure elevated up to 207/71.  Home blood pressure regimen includes losartan 100 mg daily. ?-Continue losartan ?-Hydralazine IV as needed for elevated blood pressure ? ?Leukocytosis ?Acute.  WBC elevated at 10.7.  Suspect this is reactive in nature to acute stroke.  Urinalysis did not show significant signs of infection. ?-Check chest x-ray ?-Recheck CBC tomorrow morning ? ?Hyperlipidemia ?Acute.  LDL elevated at 164. ?-Start atorvastatin 80 mg daily  ? ? ?Advance Care Planning:   Code Status: Full Code  ? ?Consults:  Neurology ? ?Family Communication: Husband updated at bedside ? ?Severity of Illness: ?The appropriate patient status for this patient is INPATIENT. Inpatient status is judged to be reasonable and necessary in order to provide the required intensity of service to ensure the patient's safety. The patient's presenting symptoms, physical exam findings, and initial radiographic and laboratory data in the context of their chronic comorbidities is felt to place them at high risk for further clinical deterioration. Furthermore, it is not anticipated that the patient will be medically stable for discharge from the hospital within 2 midnights of admission.  ? ?* I certify that at the point of admission it is my clinical judgment that the patient will require inpatient hospital care spanning beyond 2 midnights from the point of admission due to high intensity of service, high risk for further deterioration and high frequency of surveillance required.* ? ?Author: ?Norval Morton, MD ?11/18/2021 3:36 PM ? ?For on call review [email or web address].  ?

## 2021-11-18 NOTE — ED Notes (Signed)
Pt back from MRI at this time

## 2021-11-18 NOTE — ED Notes (Signed)
CT tech notified that the patient is now ready for her scan ?

## 2021-11-18 NOTE — Hospital Course (Signed)
Friday night woke up sweating when she went to the bathroom and felt nauseated and dizzy she subsequently started vomiting to the point of retching  ? ?Call the on call dr. At Dr. Eden Emms office.  ? ?Call doctor on Saturday and got prescription for Zofran which improved  ?Then her fingers started getting tingling on lefts side and felt numb ? ?Up tp elbow but has improved and now just the tips ? ?Balance was problematic  ?Left sided weakness needing help to bathroom ?Need to used a walker to get around  ? ?No nystamus or facial asymmetry  ?

## 2021-11-18 NOTE — ED Notes (Signed)
Patient transported to MRI 

## 2021-11-18 NOTE — ED Notes (Signed)
Pt husband informed this EMT that the pt would like to be re-evaluated by the triage nurse. Triage nurse was informed. ?

## 2021-11-18 NOTE — ED Notes (Signed)
Hospitalist at bedside at this time 

## 2021-11-18 NOTE — ED Notes (Signed)
Patient transported to vascular. 

## 2021-11-18 NOTE — ED Provider Notes (Signed)
?[facility] ?Provider Note ? ? ?CSN: [id number] ?Arrival date & time: 11/17/21  2136 ? ?  ? ?History ? ?Chief Complaint  ?Patient presents with  ? Difficulty Walking  ? ? ?Michelle Jacobson is a 76 y.o. female. ? ?HPI ?76 year old female with a history of hypertension on losartan presents with high blood pressure and left-sided weakness/tingling.  On 4/28 she developed vomiting and vomited multiple times.  Sometime thereafter she developed tingling in her left hand/fingers.  Also noticed some weakness, primarily the left leg but her left arm did not feel right either.  Since then she has had some difficulty walking and occasionally has had to use a walker that she has left over.  Felt acutely dizzy when it first started though that seems to be a little better.  No vision changes.  A little bit of headache on and off at night but no severe headache.  Tingling has persisted.  She does report there is seems to be a calf discomfort in the left side as well and she is not sure because she is walking funny or because she got cramping from vomiting but its been there the whole time.  Yesterday her blood pressure was 494 systolic when she checked at home and so because of this she became concerned and came to the emergency department.  She has been waiting in the waiting room for over 13 hours. ? ?Home Medications ?Prior to Admission medications   ?Medication Sig Start Date End Date Taking? Authorizing Provider  ?cholecalciferol (VITAMIN D) 1000 UNITS tablet Take 1,000 Units by mouth daily.   Yes [provider]  ?dorzolamide-timolol (COSOPT) 22.3-6.8 MG/ML ophthalmic solution Place 1 drop into the left eye 2 (two) times daily. 10/09/21  Yes [provider]  ?latanoprost (XALATAN) 0.005 % ophthalmic solution Place 1 drop into both eyes at bedtime. 09/17/21  Yes [provider]  ?losartan (COZAAR) 100 MG tablet Take 100 mg by mouth daily.   Yes [provider]  ?ondansetron (ZOFRAN) 8 MG tablet Take 8 mg by mouth every 8 (eight) hours as needed for nausea or vomiting.   Yes [provider]  ?ipratropium (ATROVENT) 0.03 % nasal spray Place 2 sprays into the nose 2 (two) times daily. 08/17/11 08/16/12  Harrison Mons, PA  ?pseudoephedrine (SUDAFED) 120 MG 12 hr tablet Take 120 mg by mouth every 12 (twelve) hours. ?Patient not taking: Reported on 11/18/2021    [provider]  ?   ? ?Allergies    ?Patient has no known allergies.   ? ?Review of Systems   ?Review of Systems  ?Eyes:  Negative for visual disturbance.  ?Cardiovascular:  Negative for leg swelling.  ?Musculoskeletal:  Positive for gait problem and myalgias.  ?Neurological:  Positive for dizziness, weakness, numbness and headaches.  ? ?Physical Exam ?Updated Vital Signs ?BP (!) 177/67   Pulse 68   Temp 97.9 ?F (36.6 ?C) (Oral)   Resp 17   Ht '5\' 3"'$  (1.6 m)   Wt 68 kg   SpO2 99%   BMI 26.56 kg/m?  ?Physical Exam ?Vitals and nursing note reviewed.  ?Constitutional:   ?   General: She is not in acute distress. ?   Appearance: She is well-developed. She is not ill-appearing or diaphoretic.  ?HENT:  ?   Head: Normocephalic and atraumatic.  ?Eyes:  ?   Extraocular Movements: Extraocular movements intact.  ?Cardiovascular:  ?   Rate and Rhythm: Normal rate and regular  rhythm.  ?   Pulses:     ?     Posterior tibial pulses are 2+ on the left side.  ?   Heart sounds: Normal heart sounds.  ?Pulmonary:  ?   Effort: Pulmonary effort is normal.  ?   Breath sounds: Normal breath sounds.  ?Abdominal:  ?   Palpations: Abdomen is soft.  ?   Tenderness: There is no abdominal tenderness.  ?Musculoskeletal:  ?   Comments: No left calf tenderness or swelling appreciated  ?Skin: ?   General: Skin is warm and dry.  ?Neurological:  ?   Mental Status: She is alert.  ?   Comments: CN 3-12 grossly intact. 5/5 strength in all 4 extremities.  The left lower extremity seems to be perhaps slightly weaker than the left but  is hard to tell if this is pain related or weakness.  She is able to get up and walk on her own and does not appear ataxic. Grossly normal sensation. Normal finger to nose.   ? ? ?ED Results / Procedures / Treatments   ?Labs ?(all labs ordered are listed, but only abnormal results are displayed) ?Labs Reviewed  ?CBC - Abnormal; Notable for the following components:  ?    Result Value  ? WBC 10.7 (*)   ? All other components within normal limits  ?DIFFERENTIAL - Abnormal; Notable for the following components:  ? Monocytes Absolute 1.2 (*)   ? All other components within normal limits  ?COMPREHENSIVE METABOLIC PANEL - Abnormal; Notable for the following components:  ? CO2 20 (*)   ? Glucose, Bld 116 (*)   ? All other components within normal limits  ?URINALYSIS, ROUTINE W REFLEX MICROSCOPIC - Abnormal; Notable for the following components:  ? APPearance HAZY (*)   ? Leukocytes,Ua TRACE (*)   ? Bacteria, UA RARE (*)   ? All other components within normal limits  ?I-STAT CHEM 8, ED - Abnormal; Notable for the following components:  ? Glucose, Bld 115 (*)   ? Calcium, Ion 1.00 (*)   ? TCO2 21 (*)   ? Hemoglobin 15.3 (*)   ? All other components within normal limits  ?RESP PANEL BY RT-PCR (FLU A&B, COVID) ARPGX2  ?ETHANOL  ?PROTIME-INR  ?APTT  ?RAPID URINE DRUG SCREEN, HOSP PERFORMED  ?LIPID PANEL  ?HEMOGLOBIN A1C  ? ? ?EKG ?EKG Interpretation ? ?Date/Time:  Wednesday Nov 17 2021 22:51:08 EDT ?Ventricular Rate:  69 ?PR Interval:  150 ?QRS Duration: 76 ?QT Interval:  376 ?QTC Calculation: 402 ?R Axis:   43 ?Text Interpretation: Normal sinus rhythm Nonspecific ST and T wave abnormality Abnormal ECG No previous ECGs available Confirmed by Gareth Morgan [phone]) on 11/18/2021 9:54:13 AM ? ?Radiology ?CT HEAD WO CONTRAST ? ?Result Date: 11/17/2021 ?CLINICAL DATA:  Left arm and leg weakness since last Friday, hypertension EXAM: CT HEAD WITHOUT CONTRAST TECHNIQUE: Contiguous axial images were obtained from the base of the skull  through the vertex without intravenous contrast. RADIATION DOSE REDUCTION: This exam was performed according to the departmental dose-optimization program which includes automated exposure control, adjustment of the mA and/or kV according to patient size and/or use of iterative reconstruction technique. COMPARISON:  None Available. FINDINGS: Brain: No acute infarct or hemorrhage. Lateral ventricles and midline structures are grossly unremarkable. No acute extra-axial fluid collections. No mass effect. Vascular: No hyperdense vessel or unexpected calcification. Skull: Normal. Negative for fracture or focal lesion. Sinuses/Orbits: No acute finding. Other: None. IMPRESSION: 1. No acute intracranial process. Electronically  Signed   By: Randa Ngo M.D.   On: 11/17/2021 23:30  ? ?MR BRAIN WO CONTRAST ? ?Result Date: 11/18/2021 ?CLINICAL DATA:  Provided history: Neuro deficit, acute, stroke suspected. EXAM: MRI HEAD WITHOUT CONTRAST TECHNIQUE: Multiplanar, multiecho pulse sequences of the brain and surrounding structures were obtained without intravenous contrast. COMPARISON:  Noncontrast head CT 11/17/2021. FINDINGS: Brain: No age-advanced or lobar predominant parenchymal atrophy. 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 Mild-to-moderate multifocal T2 FLAIR hyperintense signal abnormality within the cerebral white matter, nonspecific but compatible with chronic small vessel ischemic disease. No evidence of an intracranial mass. No chronic intracranial blood products. No extra-axial fluid collection. No midline shift. Vascular: Maintained flow voids within the proximal large arterial vessels. Skull and upper cervical spine: No focal suspicious marrow lesion. Sinuses/Orbits: No mass or acute finding within the imaged orbits. Small mucous retention cyst within the right maxillary sinus. Mild mucosal thickening within the left maxillary sinus. Mild mucosal thickening scattered within the bilateral  ethmoid air cells. 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 Mild-to-moderate chronic small vessel ischemic changes within the cerebral white matter. Mild paranasal sinus

## 2021-11-18 NOTE — ED Notes (Addendum)
On several occasions throughout the night with and without family at the bedside; this RN explains to the patient that the IV that was placed previously by another RN is very positional. This RN educates the patient and family on many occasions that this means that her arm must be straight in order for the IV pump to not beep. This RN offers to help the patient position her arm over blankets and pillows as the patient reports that she doesn't want the IV to be placed anywhere different. Patient acknowledges that the IVF are to keep her hydrated. After offering the patient to let this RN place an IV elsewhere, the patient refuses again and also reports that she no longer wants her IV fluids due to the machine beeping often while she is trying to sleep. Patient request that this RN now take her IV out; this RN re-educates the patient on the reason that we have the IV and its uses for the future. Patient then contemplates leaving AMA saying "I don't guess I'll die without these IVF".  ?Patient has also refused to keep her blood pressure on for continuous monitoring. This RN to check periodically.  ?

## 2021-11-18 NOTE — ED Notes (Addendum)
Patient ambulates to the restroom with one assist. Patient refuses the purewick despite best efforts to educate the patient. Family members updated on the plan of care ?

## 2021-11-18 NOTE — Consult Note (Addendum)
NEUROLOGY CONSULTATION NOTE  ? ?Date of service: Nov 18, 2021 ?Patient Name: Michelle Jacobson ?MRN:  [id number] ?DOB:  05/15/1946 ?Reason for consult: "Left-sided weakness" ?_ _ _   _ __   _ __ _ _  __ __   _ __   __ _ ? ?History of Present Illness  ?Michelle Jacobson is a 75 y.o. female with PMH significant for  has a past medical history of Hypertension and Osteopenia. who presents with left-sided weakness. ? ?Patient states that she started feeling poorly on Friday night when she woke up from sleeping to use the restroom.  She felt dizzy with associated nausea and emesis.  She continued to vomit throughout the night with associated retching.  On Saturday, the patient's symptoms progressed and her husband called the on-call physician at her PCP.  She received Zofran for nausea with improvement of her symptoms.  On Saturday she also started to be experiencing some left upper extremity numbness and tingling that extended to her elbow.  She had associated gait instability with difficulty balancing.  She also felt weak on her left lower extremity.  She denies any confusion, change in vision, headache, facial asymmetry.  ?  ? ?ROS  ? ?Constitutional Denies weight loss, fever and chills.   ?HEENT Denies changes in vision and hearing.   ?Respiratory Denies SOB and cough.   ?CV Denies palpitations and CP   ?GI Denies abdominal pain, endorses dizziness and emesis  ?GU Denies dysuria and urinary frequency.   ?MSK Denies myalgia and joint pain.   ?Skin Denies rash and pruritus.   ?Neurological Denies headache and syncope.   ?Psychiatric Denies recent changes in mood. Denies anxiety and depression.   ? ?Past History  ? ?Past Medical History:  ?Diagnosis Date  ? Hypertension   ? Osteopenia   ? ?History reviewed. No pertinent surgical history. ?Family History  ?Problem Relation Age of Onset  ? Stroke Mother   ?     in 32's  ? Heart disease Father   ?     rheumatic heart disease  ? Breast cancer Neg Hx   ? ?Social History   ? ?Socioeconomic History  ? Marital status: Married  ?  Spouse name: Not on file  ? Number of children: Not on file  ? Years of education: Not on file  ? Highest education level: Not on file  ?Occupational History  ? Not on file  ?Tobacco Use  ? Smoking status: Never  ? Smokeless tobacco: Not on file  ?Substance and Sexual Activity  ? Alcohol use: Never  ? Drug use: Never  ? Sexual activity: Not on file  ?Other Topics Concern  ? Not on file  ?Social History Narrative  ? Not on file  ? ?Social Determinants of Health  ? ?Financial Resource Strain: Not on file  ?Food Insecurity: Not on file  ?Transportation Needs: Not on file  ?Physical Activity: Not on file  ?Stress: Not on file  ?Social Connections: Not on file  ? ?No Known Allergies ? ?Medications  ?(Not in a hospital admission) ?  ? ?Vitals  ? ?Vitals:  ? 11/18/21 1209 11/18/21 1230 11/18/21 1300 11/18/21 1511  ?BP: (!) 166/67 (!) 162/74 (!) 168/74 (!) 177/67  ?Pulse: 72 73 71 68  ?Resp: '18 16 15 17  '$ ?Temp:      ?TempSrc:      ?SpO2: 100% 100% 100% 99%  ?Weight:      ?Height:      ?  ? ?  Body mass index is 26.56 kg/m?. ? ?Physical Exam  ? ?General: Laying comfortably in bed; in no acute distress.  ?HENT: Normal oropharynx and mucosa. Normal external appearance of ears and nose.  ?Neck: Supple, no pain or tenderness  ?CV: RRR. No peripheral edema.  ?Pulmonary: Symmetric Chest rise. Normal respiratory effort.  ?Abdomen: Soft to touch, non-tender.  ?Ext: No cyanosis, edema, or deformity  ?Skin: No rash. Normal palpation of skin.   ?Musculoskeletal: Normal digits and nails by inspection. No clubbing.  ? ?Neurologic Examination  ?Mental status/Cognition: Alert, oriented to self, place, month and year, good attention.  ?Speech/language: Fluent, comprehension intact, object naming intact, repetition intact.  ?Cranial nerves:  ? CN II Pupils equal and reactive to light, no VF deficits   ? CN III,IV,VI EOM intact, no gaze preference or deviation, no nystagmus   ? CN V  normal sensation in V1, V2, and V3 segments bilaterally   ? CN VII no asymmetry, no nasolabial fold flattening   ? CN VIII normal hearing to speech   ? CN IX & X normal palatal elevation, no uvular deviation   ? CN XI 5/5 head turn and 5/5 shoulder shrug bilaterally   ? CN XII midline tongue protrusion   ? ?Motor:  ?Muscle bulk: normal, tone normal, pronator drift none, tremor none ?Left upper extremity 4+/5, right upper extremity 5 out of 5 ?Left lower extremity 4+/5, right lower extremity 5/5 ? ?Reflexes: ?Reflexes 2+ bilateral upper and lower extremities. ? ?Sensation: ?She has decreased sensation throughout the left side to temperature. ? ?Coordination/Complex Motor:  ?She has significant ataxia on the left ? ?Labs  ? ?CBC:  ?Recent Labs  ?Lab 11/17/21 ?2233 11/17/21 ?2253  ?WBC 10.7*  --   ?NEUTROABS 7.4  --   ?HGB 14.6 15.3*  ?HCT 43.6 45.0  ?MCV 91.2  --   ?PLT 310  --   ? ? ?Basic Metabolic Panel:  ?Lab Results  ?Component Value Date  ? NA 137 11/17/2021  ? K 4.0 11/17/2021  ? CO2 20 (L) 11/17/2021  ? GLUCOSE 115 (H) 11/17/2021  ? BUN 21 11/17/2021  ? CREATININE 0.80 11/17/2021  ? CALCIUM 9.2 11/17/2021  ? GFRNONAA >60 11/17/2021  ? ?Lipid Panel: No results found for: [place] ?HgbA1c: No results found for: HGBA1C ?Urine Drug Screen:  ?   ?Component Value Date/Time  ? LABOPIA NONE DETECTED 11/17/2021 2248  ? COCAINSCRNUR NONE DETECTED 11/17/2021 2248  ? LABBENZ NONE DETECTED 11/17/2021 2248  ? AMPHETMU NONE DETECTED 11/17/2021 2248  ? THCU NONE DETECTED 11/17/2021 2248  ? LABBARB NONE DETECTED 11/17/2021 2248  ?  ?Alcohol Level  ?   ?Component Value Date/Time  ? ETH <10 11/17/2021 2233  ? ? ?CT Head without contrast: ?FINDINGS: ?Brain: No acute infarct or hemorrhage. Lateral ventricles and ?midline structures are grossly unremarkable. No acute extra-axial ?fluid collections. No mass effect. ?Vascular: No hyperdense vessel or unexpected calcification. ?Skull: Normal. Negative for fracture or focal  lesion. ?Sinuses/Orbits: No acute finding  ?Other: None. ?IMPRESSION: ?1. No acute intracranial process. ? ? ?MRI Brain WO Contrast: ?FINDINGS: ?Brain: ?No age-advanced or lobar predominant parenchymal atrophy. 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 ?Mild-to-moderate multifocal T2 FLAIR hyperintense signal abnormality ?within the cerebral white matter, nonspecific but compatible with ?chronic small vessel ischemic disease ?No evidence of an intracranial mass. ?No chronic intracranial blood products. ?No extra-axial fluid collection. ?No midline shift. ?Vascular: Maintained flow voids within the proximal large arterial ?vessels. ?Skull  and upper cervical spine: No focal suspicious marrow lesion. ?Sinuses/Orbits: No mass or acute finding within the imaged orbits. ?Small mucous retention cyst within the right maxillary sinus. Mild ?mucosal thickening within the left maxillary sinus. Mild mucosal ?thickening scattered within the bilateral ethmoid air cells. ?  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 ?Mild-to-moderate chronic small vessel ischemic changes within the ?cerebral white matter ?Mild paranasal sinus disease, as described. ? ? ?Impression  ? 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  Patient denies any history of arrhythmia concerning for embolic phenomena.  However, she will likely need further work-up with a Holter monitor at discharge for further evaluation of embolic etiologies.  Patient presented outside the stroke window and symptoms are not significant enough to impact her life being further interventions. ? ?Recommendations  ?Acute infarct of the pons.: ?-Lipid panel: Total cholesterol 238, LDL 164, HDL 46 ?-A1c: 5.6 ?- Start atorvastatin 80 mg daily ?- Cont DAPT with ASA and plavix  ?- Echo pending  ?- Cont  cardiac monitoring while inpatient ?-We will likely need CTA head and neck ?- VTE: Enoxaparin ?-PT/OT/SLP consult.  ? ?______________________________________________________________________ ? ? ?Thank you for the opportunity to take pa

## 2021-11-18 NOTE — ED Notes (Signed)
Patient taken to CT at this time. 2 family member at bedside and were present in the room when the hospitalist was assessing. ?

## 2021-11-18 NOTE — ED Notes (Signed)
Patient ambulates with one assist to the restroom at this time; patient seems unsteady and reports feeling dizzy (as she is looking around and walking) patient able to use restroom with no difficulty and ambulate to the restroom door to meet this RN. Patient verbalizes awareness of the plan of care and understanding of such  ?

## 2021-11-18 NOTE — Progress Notes (Signed)
Lower extremity venous has been completed.  ? ?Preliminary results in CV Proc.  ? ?Michelle Jacobson ?11/18/2021 1:43 PM    ?

## 2021-11-19 ENCOUNTER — Telehealth: Payer: Self-pay | Admitting: Surgery

## 2021-11-19 ENCOUNTER — Inpatient Hospital Stay (HOSPITAL_COMMUNITY): Payer: PPO

## 2021-11-19 DIAGNOSIS — I160 Hypertensive urgency: Secondary | ICD-10-CM | POA: Diagnosis not present

## 2021-11-19 DIAGNOSIS — I639 Cerebral infarction, unspecified: Secondary | ICD-10-CM | POA: Diagnosis not present

## 2021-11-19 DIAGNOSIS — E7800 Pure hypercholesterolemia, unspecified: Secondary | ICD-10-CM

## 2021-11-19 DIAGNOSIS — I6389 Other cerebral infarction: Secondary | ICD-10-CM | POA: Diagnosis not present

## 2021-11-19 LAB — ECHOCARDIOGRAM COMPLETE
AR max vel: 3.49 cm2
AV Area VTI: 3.23 cm2
AV Area mean vel: 3.37 cm2
AV Mean grad: 3 mmHg
AV Peak grad: 6.4 mmHg
Ao pk vel: 1.26 m/s
Area-P 1/2: 3.72 cm2
Height: 63 in
MV VTI: 3.63 cm2
S' Lateral: 1.7 cm
Weight: 2398.6 oz

## 2021-11-19 MED ORDER — PERFLUTREN LIPID MICROSPHERE
1.0000 mL | INTRAVENOUS | Status: AC | PRN
  Administered 2021-11-19: 2 mL via INTRAVENOUS
  Filled 2021-11-19: qty 10

## 2021-11-19 MED ORDER — CLOPIDOGREL BISULFATE 75 MG PO TABS: 75.0000 mg | ORAL_TABLET | Freq: Every day | ORAL | 0 refills | Status: AC

## 2021-11-19 MED ORDER — ATORVASTATIN CALCIUM 80 MG PO TABS: 80.0000 mg | ORAL_TABLET | Freq: Every day | ORAL | 0 refills | Status: AC

## 2021-11-19 MED ORDER — ASPIRIN 81 MG PO TBEC: 81.0000 mg | DELAYED_RELEASE_TABLET | Freq: Every day | ORAL | 0 refills | Status: AC

## 2021-11-19 NOTE — ED Notes (Signed)
RN went into pt room to update vital signs and give medication. Pt wants to elope/AMA, refusing to move to inpatient bed. RN advised against that and educated pt on waiting for the rest of her test results. Pt wants RN to page MD regarding be discharged and would like to follow up out patient regarding the physical therapy test. Pt has been ambulating to and from the bathroom independently. MD paged.  ?

## 2021-11-19 NOTE — Telephone Encounter (Signed)
ED RNCM received TOC consult  from Dr. Marlowe Sax concerning HHPT recommendation.  Met with patient and family at bedside to discuss  [facility] recommendations patient declined [facility] but would like outpatient PT.  Referral was sent into [facility].  Informed patient and family that the office will contact them to schedule the initial assessment, also provided information on the AVS to contact the office if they do not receive a call in 2-3 business days.  ?

## 2021-11-19 NOTE — Progress Notes (Addendum)
*  PRELIMINARY RESULTS* ?Echocardiogram ?2D Echocardiogram has been performed with Def[facility]. ? ?Michelle Jacobson ?11/19/2021, 2:29 PM ?

## 2021-11-19 NOTE — Progress Notes (Addendum)
STROKE TEAM PROGRESS NOTE  ? ?INTERVAL HISTORY ?Her husband and daughter are at the bedside.  Patient states she started feeling bad on Friday night when she woke up from sleeping to use the bathroom.  She felt dizzy, nauseous and vomited multiple times.  She was able to get medication from her PCP for the nausea and did have some improvement to her symptoms.  On Saturday she started to experience the left upper extremity numbness and tingling. She also noted gait instability with difficulty balancing and weakness of her left lower extremity. ? ?Vitals:  ? 11/19/21 0100 11/19/21 0120 11/19/21 0200 11/19/21 0832  ?BP:  (!) 195/76  (!) 154/85  ?Pulse: 75 88 65 80  ?Resp: 20 (!) '21 15 18  '$ ?Temp:    98.6 ?F (37 ?C)  ?TempSrc:    Oral  ?SpO2: 94% 98% 93% 100%  ?Weight:      ?Height:      ? ?CBC:  ?Recent Labs  ?Lab 11/17/21 ?2233 11/17/21 ?2253  ?WBC 10.7*  --   ?NEUTROABS 7.4  --   ?HGB 14.6 15.3*  ?HCT 43.6 45.0  ?MCV 91.2  --   ?PLT 310  --   ? ?Basic Metabolic Panel:  ?Recent Labs  ?Lab 11/17/21 ?2233 11/17/21 ?2253  ?NA 136 137  ?K 4.0 4.0  ?CL 106 106  ?CO2 20*  --   ?GLUCOSE 116* 115*  ?BUN 20 21  ?CREATININE 0.92 0.80  ?CALCIUM 9.2  --   ? ?Lipid Panel:  ?Recent Labs  ?Lab 11/18/21 ?1551  ?CHOL 238*  ?TRIG 140  ?HDL 46  ?CHOLHDL 5.2  ?VLDL 28  ?[place] 164*  ? ?HgbA1c:  ?Recent Labs  ?Lab 11/18/21 ?1551  ?HGBA1C 5.6  ? ?Urine Drug Screen:  ?Recent Labs  ?Lab 11/17/21 ?2248  ?LABOPIA NONE DETECTED  ?COCAINSCRNUR NONE DETECTED  ?LABBENZ NONE DETECTED  ?AMPHETMU NONE DETECTED  ?THCU NONE DETECTED  ?LABBARB NONE DETECTED  ?  ?Alcohol Level  ?Recent Labs  ?Lab 11/17/21 ?2233  ?ETH <10  ? ? ?IMAGING past 24 hours ?CT ANGIO HEAD NECK W WO CM ? ?Result Date: 11/18/2021 ?CLINICAL DATA:  Stroke.  Left-sided numbness EXAM: CT ANGIOGRAPHY HEAD AND NECK TECHNIQUE: Multidetector CT imaging of the head and neck was performed using the standard protocol during bolus administration of intravenous contrast. Multiplanar CT image  reconstructions and MIPs were obtained to evaluate the vascular anatomy. Carotid stenosis measurements (when applicable) are obtained utilizing NASCET criteria, using the distal internal carotid diameter as the denominator. RADIATION DOSE REDUCTION: This exam was performed according to the departmental dose-optimization program which includes automated exposure control, adjustment of the mA and/or kV according to patient size and/or use of iterative reconstruction technique. CONTRAST:  67m OMNIPAQUE IOHEXOL 350 MG/ML SOLN COMPARISON:  MRI head 11/18/2021 FINDINGS: CT HEAD FINDINGS Brain: 2 hypodensities in the right pons compatible with acute infarct as noted on diffusion-weighted imaging. Patchy white matter hypodensity consistent with chronic microvascular ischemia. No acute hemorrhage or mass. Ventricle size normal. Vascular: Negative for hyperdense vessel Skull: Negative Sinuses: Negative Orbits: Negative Review of the MIP images confirms the above findings CTA NECK FINDINGS Aortic arch: Standard branching. Imaged portion shows no evidence of aneurysm or dissection. No significant stenosis of the major arch vessel origins. Right carotid system: Mild atherosclerotic disease right carotid bifurcation. Negative for stenosis. Left carotid system: Normal left carotid.  Negative for stenosis Vertebral arteries: Normal vertebral arteries bilaterally which are widely patent to the skull base. Skeleton: No  focal abnormality. Other neck: Enlargement the right lobe of the thyroid with heterogeneous density. Coarse calcifications in the right lobe. Cystic nodule right thyroid measuring 15.3 mm. Upper chest: Lung apices clear bilaterally. Review of the MIP images confirms the above findings CTA HEAD FINDINGS Anterior circulation: Minimal atherosclerotic disease in the cavernous carotid bilaterally without stenosis. Dominant left anterior cerebral artery with small right anterior cerebral artery. 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  Hypoplastic right A1 segment. Middle cerebral arteries patent bilaterally without stenosis. Posterior circulation: Both vertebral arteries patent to the basilar. PICA patent bilaterally. Basilar widely patent. Focal moderate to severe stenosis left P1 P2 junction. Moderate stenosis right P1 P2 junction. Fetal origin right posterior cerebral artery. Venous sinuses: Normal venous enhancement Anatomic variants: None Review of the MIP images confirms the above findings IMPRESSION: 1. Small hypodensities right pons compatible with acute infarct as confirmed on MRI today. 2. Chronic microvascular ischemic change. No intracranial hemorrhage. 3. No significant carotid or vertebral artery stenosis in the neck. 4. 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 5. Heterogeneously enlarged right lobe of the thyroid with 15.3 mm nodule. Thyroid ultrasound recommended. (Ref: J Am Coll Radiol. 2015 Feb;12(2): 143-50). Electronically Signed   By: Franchot Gallo M.D.   On: 11/18/2021 19:24  ? ?MR BRAIN WO CONTRAST ? ?Result Date: 11/18/2021 ?CLINICAL DATA:  Provided history: Neuro deficit, acute, stroke suspected. EXAM: MRI HEAD WITHOUT CONTRAST TECHNIQUE: Multiplanar, multiecho pulse sequences of the brain and surrounding structures were obtained without intravenous contrast. COMPARISON:  Noncontrast head CT 11/17/2021. FINDINGS: Brain: No age-advanced or lobar predominant parenchymal atrophy. 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 Mild-to-moderate multifocal T2 FLAIR hyperintense signal abnormality within the cerebral white matter, nonspecific but compatible with chronic small vessel ischemic disease. No evidence of an intracranial mass. No chronic intracranial blood products. No extra-axial fluid collection. No midline shift. Vascular: Maintained flow voids within the proximal large arterial vessels. Skull and upper cervical spine: No focal suspicious marrow lesion. Sinuses/Orbits: No  mass or acute finding within the imaged orbits. Small mucous retention cyst within the right maxillary sinus. Mild mucosal thickening within the left maxillary sinus. Mild mucosal thickening scattered within the bilateral ethmoid air cells. 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 Mild-to-moderate chronic small vessel ischemic changes within the cerebral white matter. Mild paranasal sinus disease, as described. Electronically Signed   By: Kellie Simmering D.O.   On: 11/18/2021 15:03  ? ?DG CHEST PORT 1 VIEW ? ?Result Date: 11/18/2021 ?CLINICAL DATA:  Shortness of breath.  Left arm and leg weakness. EXAM: PORTABLE CHEST 1 VIEW COMPARISON:  None Available. FINDINGS: Cardiac silhouette and mediastinal contours are within normal limits. Mild calcification within aortic arch. Mild bibasilar bronchovascular crowding. No pleural effusion or pneumothorax. No acute skeletal abnormality. IMPRESSION: No acute lung process. Electronically Signed   By: Yvonne Kendall M.D.   On: 11/18/2021 18:25  ? ?VAS [place] LOWER EXTREMITY VENOUS (DVT) (7a-7p) ? ?Result Date: 11/18/2021 ? Lower Venous DVT Study Patient Name:  MAKALAH ASBERRY  Date of Exam:   11/18/2021 Medical Rec #: [id number]       Accession #:    [id number] Date of Birth: September 19, 1945      Patient Gender: F Patient Age:   35 years Exam Location:  [facility] Procedure:      VAS [place] LOWER EXTREMITY VENOUS (DVT) Referring Phys: Nicki Reaper [place] --------------------------------------------------------------------------------  Indications: Pain.  Comparison Study: no  prior Performing Technologist: Archie Patten RVS  Examination Guidelines: A complete evaluation includes B-mode imaging, spectral Doppler, color Doppler, and power Doppler as needed of all accessible portions of each vessel. Bilateral testing is considered an integral part of a complete examination. Limited examinations for reoccurring indications may be performed as noted. The reflux  portion of the exam is performed with the patient in reverse Trendelenburg.  +-----+---------------+---------+-----------+----------+--------------+ RIGHTCompressibilityPhasicitySpontaneityPropertiesThrombus

## 2021-11-19 NOTE — ED Notes (Signed)
Admitting MD called RN. MD going to work with TOC in discharging the pt. ?

## 2021-11-20 ENCOUNTER — Other Ambulatory Visit: Payer: Self-pay | Admitting: Neurology

## 2021-11-20 DIAGNOSIS — I639 Cerebral infarction, unspecified: Secondary | ICD-10-CM

## 2021-11-22 ENCOUNTER — Encounter: Payer: Self-pay | Admitting: Adult Health

## 2021-11-22 NOTE — Discharge Summary (Signed)
?Physician Discharge Summary ?  ?Patient: Michelle Jacobson MRN: [id number] DOB: 10-03-1945  ?Admit date:     11/17/2021  ?Discharge date: 11/19/2021  ?Discharge Physician: Berle Mull  ? ?PCP: Shon Baton, MD  ? ?Recommendations at discharge:  ? Follow up with PCP in 1 week ? ?Discharge Diagnoses: ?Principal Problem: ?  CVA (cerebral vascular accident) ([place]) ?Active Problems: ?  Hypertensive urgency ?  Leukocytosis ?  Hyperlipidemia ? ?Resolved Problems: ?  * No resolved hospital problems. * ? ?Hospital Course: ?Michelle Jacobson is a 76 y.o. female with medical history significant of hypertension and osteopenia presents with complaints of left-sided numbness and weakness.  Work-up was positive for acute stroke.  Neurology was consulted. ? ?Assessment and Plan: ?CVA Acute/subacute.   ?Patient presents with complaints of left-sided numbness and weakness which started 5 days ago.   ?MRI significant for multiple acute infarcts of the right pons.  Patient has been started on aspirin and Plavix. ?Neurology was consulted. ?Echocardiogram shows preserved EF without any wall motion abnormalities. ?LDL 164 started on Lipitor. ?Hemoglobin A1c 5.6.  Recommend diet modification. ?PT OT evaluation was pending but the patient was able to ambulate in the hallway without any assistance.  Outpatient referral was placed. ?  ?Hypertensive urgency/emergency ?Upon admission into the emergency department patient was noted to have blood pressure elevated up to 207/71.  Home blood pressure regimen includes losartan 100 mg daily. ?-Continue losartan ?  ?Leukocytosis ?Acute.  WBC elevated at 10.7.  Suspect this is reactive in nature to acute stroke.  Urinalysis did not show significant signs of infection. ?  ?Hyperlipidemia ?Acute.  LDL elevated at 164. ?-Start Lipitor 80 mg. ? ?Consultants: Neurology  ?Procedures performed: Echocardiogram   ?Disposition: Home ?Diet recommendation:  ?Discharge Diet Orders (From admission, onward)  ? ?  Start      Ordered  ? 11/19/21 0000  Diet - low sodium heart healthy       ? 11/19/21 1720  ? ?  ?  ? ?  ? ?Cardiac diet ?DISCHARGE MEDICATION: ?Allergies as of 11/19/2021   ?No Known Allergies ?  ? ?  ?Medication List  ?  ? ?STOP taking these medications   ? ?pseudoephedrine 120 MG 12 hr tablet ?Commonly known as: SUDAFED ?  ? ?  ? ?TAKE these medications   ? ?aspirin 81 MG EC tablet ?Take 1 tablet (81 mg total) by mouth daily. Swallow whole. ?  ?atorvastatin 80 MG tablet ?Commonly known as: LIPITOR ?Take 1 tablet (80 mg total) by mouth daily. ?  ?cholecalciferol 1000 units tablet ?Commonly known as: VITAMIN D ?Take 1,000 Units by mouth daily. ?  ?clopidogrel 75 MG tablet ?Commonly known as: PLAVIX ?Take 1 tablet (75 mg total) by mouth daily for 20 days. ?  ?dorzolamide-timolol 22.3-6.8 MG/ML ophthalmic solution ?Commonly known as: COSOPT ?Place 1 drop into the left eye 2 (two) times daily. ?  ?ipratropium 0.03 % nasal spray ?Commonly known as: ATROVENT ?Place 2 sprays into the nose 2 (two) times daily. ?  ?latanoprost 0.005 % ophthalmic solution ?Commonly known as: XALATAN ?Place 1 drop into both eyes at bedtime. ?  ?losartan 100 MG tablet ?Commonly known as: COZAAR ?Take 100 mg by mouth daily. ?  ?ondansetron 8 MG tablet ?Commonly known as: ZOFRAN ?Take 8 mg by mouth every 8 (eight) hours as needed for nausea or vomiting. ?  ? ?  ? ? Follow-up Information   ? ? [place] [facility] up.   ?Specialty:  Rehabilitation ?Why: The office will call you to schedule your initial assessment visit. If you do not receive a call within 2 -3 business day, please contact the office at the phone number above. ?Contact information: ?[place] ?[id number] ?[place] ?[phone] ? ?  ?  ? ? [facility]. Schedule an appointment as soon as possible for a visit in 1 month(s).   ?Specialty: Neurology ?Why: stroke clinic ?Contact information: ?[place] ?[phone] ? ?  ?  ? ?  ?  ? ?  ? ?Discharge Exam: ?Filed Weights  ? 11/17/21 2208  ?Weight: 68 kg  ? ?General: Appear in mild distress, no Rash; Oral Mucosa Clear, moist. no Abnormal Neck Mass Or lumps, Conjunctiva normal  ?Cardiovascular: S1 and S2 Present, no Murmur, ?Respiratory: good respiratory effort, Bilateral Air entry present and CTA, no Crackles, no wheezes ?Abdomen: Bowel Sound present, Soft and no tenderness ?Extremities: no Pedal edema ?Neurology: alert and oriented to time, place, and person ?affect appropriate. no new focal deficit ?Gait not checked due to patient safety concerns  ? ? ?Condition at discharge: good ? ?The results of significant diagnostics from this hospitalization (including imaging, microbiology, ancillary and laboratory) are listed below for reference.  ? ?Imaging Studies: ?CT ANGIO HEAD NECK W WO CM ? ?Result Date: 11/18/2021 ?CLINICAL DATA:  Stroke.  Left-sided numbness EXAM: CT ANGIOGRAPHY HEAD AND NECK TECHNIQUE: Multidetector CT imaging of the head and neck was performed using the standard protocol during bolus administration of intravenous contrast. Multiplanar CT image reconstructions and MIPs were obtained to evaluate the vascular anatomy. Carotid stenosis measurements (when applicable) are obtained utilizing NASCET criteria, using the distal internal carotid diameter as the denominator. RADIATION DOSE REDUCTION: This exam was performed according to the departmental dose-optimization program which includes automated exposure control, adjustment of the mA and/or kV according to patient size and/or use of iterative reconstruction technique. CONTRAST:  69m OMNIPAQUE IOHEXOL 350 MG/ML SOLN COMPARISON:  MRI head 11/18/2021 FINDINGS: CT HEAD FINDINGS Brain: 2 hypodensities in the right pons compatible with acute infarct as noted on diffusion-weighted imaging. Patchy white matter hypodensity consistent with chronic  microvascular ischemia. No acute hemorrhage or mass. Ventricle size normal. Vascular: Negative for hyperdense vessel Skull: Negative Sinuses: Negative Orbits: Negative Review of the MIP images confirms the above findings CTA NECK FINDINGS Aortic arch: Standard branching. Imaged portion shows no evidence of aneurysm or dissection. No significant stenosis of the major arch vessel origins. Right carotid system: Mild atherosclerotic disease right carotid bifurcation. Negative for stenosis. Left carotid system: Normal left carotid.  Negative for stenosis Vertebral arteries: Normal vertebral arteries bilaterally which are widely patent to the skull base. Skeleton: No focal abnormality. Other neck: Enlargement the right lobe of the thyroid with heterogeneous density. Coarse calcifications in the right lobe. Cystic nodule right thyroid measuring 15.3 mm. Upper chest: Lung apices clear bilaterally. Review of the MIP images confirms the above findings CTA HEAD FINDINGS Anterior circulation: Minimal atherosclerotic disease in the cavernous carotid bilaterally without stenosis. Dominant left anterior cerebral artery with small right anterior cerebral artery. 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 Hypoplastic right A1 segment. Middle cerebral arteries patent bilaterally without stenosis. Posterior circulation: Both vertebral arteries patent to the basilar. PICA patent bilaterally. Basilar widely patent. Focal moderate to severe stenosis left P1 P2 junction. Moderate stenosis right P1 P2 junction. Fetal origin right posterior cerebral artery. Venous sinuses: Normal venous enhancement Anatomic variants: None Review of the  MIP images confirms the above findings IMPRESSION: 1. Small hypodensities right pons compatible with acute infarct as confirmed on MRI today. 2. Chronic microvascular ischemic change. No intracranial hemorrhage. 3. No significant carotid or vertebral artery stenosis in the neck. 4. Moderate stenosis of the posterior  cerebral artery bilaterally. 5. Heterogeneously enlarged right lobe of the thyroid with 15.3 mm nodule. Thyroid ultrasound recommended. (Ref: J Am Coll Radiol. 2015 Feb;12(2): 143-50). Electronically Signed   By: Juanda Crumble

## 2021-11-23 DIAGNOSIS — R531 Weakness: Secondary | ICD-10-CM | POA: Diagnosis not present

## 2021-11-23 DIAGNOSIS — M1990 Unspecified osteoarthritis, unspecified site: Secondary | ICD-10-CM | POA: Diagnosis not present

## 2021-11-23 DIAGNOSIS — J309 Allergic rhinitis, unspecified: Secondary | ICD-10-CM | POA: Diagnosis not present

## 2021-11-23 DIAGNOSIS — I6990 Unspecified sequelae of unspecified cerebrovascular disease: Secondary | ICD-10-CM | POA: Diagnosis not present

## 2021-11-23 DIAGNOSIS — R200 Anesthesia of skin: Secondary | ICD-10-CM | POA: Diagnosis not present

## 2021-11-23 DIAGNOSIS — G4700 Insomnia, unspecified: Secondary | ICD-10-CM | POA: Diagnosis not present

## 2021-11-23 DIAGNOSIS — E559 Vitamin D deficiency, unspecified: Secondary | ICD-10-CM | POA: Diagnosis not present

## 2021-11-23 DIAGNOSIS — R269 Unspecified abnormalities of gait and mobility: Secondary | ICD-10-CM | POA: Diagnosis not present

## 2021-11-23 DIAGNOSIS — R5383 Other fatigue: Secondary | ICD-10-CM | POA: Diagnosis not present

## 2021-11-23 DIAGNOSIS — M810 Age-related osteoporosis without current pathological fracture: Secondary | ICD-10-CM | POA: Diagnosis not present

## 2021-11-23 DIAGNOSIS — E785 Hyperlipidemia, unspecified: Secondary | ICD-10-CM | POA: Diagnosis not present

## 2021-11-23 DIAGNOSIS — I10 Essential (primary) hypertension: Secondary | ICD-10-CM | POA: Diagnosis not present

## 2021-11-25 ENCOUNTER — Ambulatory Visit: Payer: PPO | Attending: Internal Medicine | Admitting: Physical Therapy

## 2021-11-25 ENCOUNTER — Encounter: Payer: Self-pay | Admitting: Physical Therapy

## 2021-11-25 VITALS — BP 144/88 | HR 68

## 2021-11-25 DIAGNOSIS — R29818 Other symptoms and signs involving the nervous system: Secondary | ICD-10-CM | POA: Insufficient documentation

## 2021-11-25 DIAGNOSIS — R2681 Unsteadiness on feet: Secondary | ICD-10-CM | POA: Insufficient documentation

## 2021-11-25 DIAGNOSIS — Z9181 History of falling: Secondary | ICD-10-CM | POA: Diagnosis not present

## 2021-11-25 DIAGNOSIS — R2689 Other abnormalities of gait and mobility: Secondary | ICD-10-CM | POA: Diagnosis not present

## 2021-11-25 DIAGNOSIS — I69354 Hemiplegia and hemiparesis following cerebral infarction affecting left non-dominant side: Secondary | ICD-10-CM | POA: Insufficient documentation

## 2021-11-25 NOTE — Therapy (Signed)
?OUTPATIENT PHYSICAL THERAPY NEURO EVALUATION ? ? ?Patient Name: Michelle Jacobson ?MRN: [id number] ?DOB:April 20, 1946, 76 y.o., female ?Today's Date: 11/25/2021 ? ?PCP: Shon Baton, MD ?REFERRING PROVIDER: Lavina Hamman, MD (will be followed by Frann Rider, NP) ? ? PT End of Session - 11/25/21 1443   ? ? Visit Number 1   ? Number of Visits 17   ? Date for PT Re-Evaluation 02/23/22   ? Authorization Type [employer]ICARE   ? PT Start Time 1356   ? PT Stop Time 1440   ? PT Time Calculation (min) 44 min   ? Equipment Utilized During Treatment Gait belt   ? Activity Tolerance Patient tolerated treatment well   ? Behavior During Therapy [facility] for tasks assessed/performed   ? ?  ?  ? ?  ? ? ?Past Medical History:  ?Diagnosis Date  ? Hypertension   ? Osteopenia   ? ?History reviewed. No pertinent surgical history. ?Patient Active Problem List  ? Diagnosis Date Noted  ? CVA (cerebral vascular accident) ([place]) 11/18/2021  ? Hypertensive urgency 11/18/2021  ? Leukocytosis 11/18/2021  ? Hyperlipidemia 11/18/2021  ? Osteopenia   ? ? ?ONSET DATE: 11/19/2021 (date of referral) ? ?REFERRING DIAG: Acute CVA.  ? ?THERAPY DIAG:  ?Unsteadiness on feet ? ?Other abnormalities of gait and mobility ? ?Other symptoms and signs involving the nervous system ? ?History of falling ? ?Hemiplegia and hemiparesis following cerebral infarction affecting left non-dominant side ([place]) ? ?SUBJECTIVE:  ?                                                                                                                                                                                           ? ?SUBJECTIVE STATEMENT: ?Went to the hospital due to elevated BP after a couple days of having nausea/vomiting, imbalance, and numbness. 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 Ambulates with a RW and sometimes walks without it at home. Is more tired after her CVA and has not been sleeping well. Falls down if she isn't careful - has had 1 since she has been  home from the hospital. Legs gave out, was just standing. Also reports feeling more clumsy. Moving quickly can make her feel off balance. Can't go outside to walk with the RW as they have a gravel driveway.  ? ?Pt accompanied by: self ? ?PERTINENT HISTORY: PMH: HTN, osteoporosis  ? ?76 year old female with history of hypertension admitted on 11/17/21 for left-sided weakness numbness, dizziness, nausea vomiting and imbalance for several days.  CT no acute abnormality.  MRI showed right pontine infarct.  CTA head and neck bilateral PCA moderate stenosis.  She was inpatient in ER, asking for AMA, did not want to wait for formal PT evaluation. Discharged on 11/19/21 ? ?PAIN:  ?Are you having pain? Yes: NPRS scale: 0/10 ?Pain location: Entire L side ?Pain description: aching ?Aggravating factors: Not sure ?Relieving factors: taking mustard to help with the cramping.  ? ?Vitals:  ? 11/25/21 1402  ?BP: (!) 144/88  ?Pulse: 68  ? ? ? ?PRECAUTIONS: Fall, has a hx of nausea/motion sickness. (Pt brought in a bucket in case she got nauseous)  ? ?FALLS: Has patient fallen in last 6 months? Yes. Number of falls 1 ? ?LIVING ENVIRONMENT: ?Lives with: lives with their spouse ?Lives in: House/apartment ?Stairs: Yes: External: 5 steps; bilateral but cannot reach both ?Has following equipment at home: Single point cane, Quad cane small base, Walker - 2 wheeled, Walker - 4 wheeled, and shower chair ? ?PLOF: Independent and Leisure: Reading her Bible, going to church, seeing grandkids.  ? ?PATIENT GOALS "wants to walk around and be normal like before her CVA"  ? ?OBJECTIVE:  ? ? ?COGNITION: ?Overall cognitive status: Within functional limits for tasks assessed ?  ?SENSATION: ?WFL ?Light touch: WFL ?Hot/Cold: WFL ? ?COORDINATION: ?Heel to shin; slower to perform with LLE ?Finger to nose; dysmetric with LLE  ? ?POSTURE: rounded shoulders ? ? ?MMT:   ? ?MMT Right ?11/25/2021 Left ?11/25/2021  ?Hip flexion 4+/5 3+/5  ?Hip extension    ?Hip abduction     ?Hip adduction    ?Hip internal rotation    ?Hip external rotation    ?Knee flexion 5/5 4/5  ?Knee extension 4+/5 4+/5  ?Ankle dorsiflexion 5/5 4+/5  ?Ankle plantarflexion    ?Ankle inversion    ?Ankle eversion    ?(Blank rows = not tested) ? ?Seated hip ABD/ADD on LLE 4+/5 ? ? ? ?TRANSFERS: ?Assistive device utilized: None  ?Sit to stand: SBA ?Stand to sit: SBA ?Performed without UE support  ? ? ?GAIT: ?Gait pattern: step through pattern, decreased arm swing- Left, decreased step length- Right, decreased stance time- Left, and decreased ankle dorsiflexion- Left ?Distance walked: Clinic distances ?Assistive device utilized: None ?Level of assistance: SBA and CGA ?Comments: Ambulated in and out of session with RW, provided pt with tennis balls for easier propulsion.  ? ?FUNCTIONAL TESTs:  ?5 times sit to stand: 13.25 seconds without UE support, performs with wide BOS ?10 meter walk test: 11.37 seconds with no AD = 2.88 ft/sec  ?TUG: 10.34 seconds with no AD  ? ? [facility] PT Assessment - 11/25/21 1430   ? ?  ? Standardized Balance Assessment  ? Standardized Balance Assessment Berg Balance Test   ?  ? Berg Balance Test  ? Sit to Stand Able to stand without using hands and stabilize independently   ? Standing Unsupported Able to stand safely 2 minutes   ? Sitting with Back Unsupported but Feet Supported on Floor or Stool Able to sit safely and securely 2 minutes   ? Stand to Sit Sits safely with minimal use of hands   ? Transfers Able to transfer safely, minor use of hands   ? Standing Unsupported with Eyes Closed Able to stand 10 seconds safely   ? Standing Unsupported with Feet Together Able to place feet together independently and stand for 1 minute with supervision   ? ?  ?  ? ?  ? ?Will finish at next session.  ? ?PATIENT SURVEYS:  ?FOTO 46 (predicted 2) ? ?TODAY'S TREATMENT:  ?N/A at eval. ? ? ?  PATIENT EDUCATION: ?Education details: Clinical findings, POC, PT will work on getting pt an order for OT for LUE  strength and coordination due to deficits from CVA. ?Person educated: Patient and Spouse ?Education method: Explanation ?Education comprehension: verbalized understanding ? ? ?HOME EXERCISE PROGRAM: ?Will provide at next session.  ? ? ? ?GOALS: ?Goals reviewed with patient? Yes ? ?SHORT TERM GOALS: Target date: 12/23/2021    ?Pt will be independent with initial HEP in order to build upon functional gains made in therapy. ? ?Baseline: ?Goal status: INITIAL ? ?2.  Pt will undergo further assessment of BERG with STG written ?Baseline:  ?Goal status: INITIAL ? ?3.  Pt will be able to go up and down 4 steps with alternating vs. Step to pattern and single handrail with supervision in order to use the stairs going in and out of the house. ?Baseline:  ?Goal status: INITIAL ? ?4.  Pt will improve gait speed with LRAD vs. No AD to at least 3.1 ft/sec in order to demo improved community mobility.  ? ?Baseline: 2.88 ft/sec, no AD ?Goal status: INITIAL ? ?5.  Pt will undergo further assessment of FGA vs. DGI with LTG written in order to demo decr fall risk.  ?Baseline:  ?Goal status: INITIAL ? ?6.  Pt will ambulate at least 200' over outdoor surfaces with no AD vs. LRAD w/ supervision in order to demo improved community mobility.  ?Baseline:  ?Goal status: INITIAL ? ?LONG TERM GOALS: Target date: 01/20/2022   ? ?Pt will be independent with final HEP in order to build upon functional gains made in therapy. ? ?Baseline:  ?Goal status: INITIAL ? ?2.  FGA/DGI goal to be written in order to demo decr fall risk. ?Baseline:  ?Goal status: INITIAL ? ?3.  Pt will ambulate at least 500' over outdoor paved/grass surfaces with no AD vs. LRAD with mod I and perform a curb in order to demo improved community mobility. ?Baseline:  ?Goal status: INITIAL ? ?4.  t will improve gait speed with LRAD vs. No AD to at least 3.4 ft/sec in order to demo improved community mobility.  ?Baseline: 2.88 ft/sec ?Goal status: INITIAL ? ?5.  Pt will improve FOTO  score to at least 65% in order to demo improved functional outcomes.  ?Baseline: 46 ?Goal status: INITIAL ? ? ?ASSESSMENT: ? ?CLINICAL IMPRESSION: ?Patient is a 76 year old female referred to Neuro OPPT for ri

## 2021-11-29 ENCOUNTER — Ambulatory Visit: Payer: PPO | Admitting: Physical Therapy

## 2021-11-29 ENCOUNTER — Encounter: Payer: Self-pay | Admitting: Physical Therapy

## 2021-11-29 VITALS — BP 132/99 | HR 80

## 2021-11-29 DIAGNOSIS — R2681 Unsteadiness on feet: Secondary | ICD-10-CM | POA: Diagnosis not present

## 2021-11-29 DIAGNOSIS — I69354 Hemiplegia and hemiparesis following cerebral infarction affecting left non-dominant side: Secondary | ICD-10-CM

## 2021-11-29 DIAGNOSIS — R2689 Other abnormalities of gait and mobility: Secondary | ICD-10-CM

## 2021-11-29 DIAGNOSIS — R29818 Other symptoms and signs involving the nervous system: Secondary | ICD-10-CM

## 2021-11-29 DIAGNOSIS — Z9181 History of falling: Secondary | ICD-10-CM

## 2021-11-29 NOTE — Patient Instructions (Signed)
Access Code: [email or web address] ?URL: [email or web address] ?Date: 11/29/2021 ?Prepared by: Elease Etienne ? ?Exercises ?- Seated Hamstring Curls with Resistance  - 1 x daily - 5 x weekly - 2 sets - 10 reps ?- Supine Bridge  - 1 x daily - 5 x weekly - 2 sets - 10 reps ?- Seated Knee Extension with Resistance  - 1 x daily - 5 x weekly - 2 sets - 10 reps ?- Standing Single Leg Stance with Counter Support  - 1 x daily - 5 x weekly - 1 sets - 2 reps - 30 seconds hold ?- Standing Tandem Balance with Counter Support  - 1 x daily - 5 x weekly - 1 sets - 2 reps - 30 seconds hold ?- Standing March with Counter Support  - 1 x daily - 5 x weekly - 2 sets - 20 reps ? *Pt instructed to use ROM comfortable on R knee for SLS/quad and hamstring exercises. ?

## 2021-11-29 NOTE — Therapy (Signed)
?OUTPATIENT PHYSICAL THERAPY TREATMENT NOTE ? ? ?Patient Name: Michelle Jacobson ?MRN: [id number] ?DOB:1946-07-04, 76 y.o., female ?Today's Date: 11/29/2021 ? ?PCP: Shon Baton, MD ?REFERRING PROVIDER: Lavina Hamman, MD (will be followed by Frann Rider, NP) ? ?END OF SESSION:  ? PT End of Session - 11/29/21 1412   ? ? Visit Number 2   ? Number of Visits 17   ? Date for PT Re-Evaluation 02/23/22   ? Authorization Type [employer]IC[employer]   ? PT Start Time 4742   ? PT Stop Time 5956   ? PT Time Calculation (min) 41 min   ? Equipment Utilized During Treatment Gait belt   ? Activity Tolerance Patient tolerated treatment well   ? Behavior During Therapy [facility] for tasks assessed/performed   ? ?  ?  ? ?  ? ? ?Past Medical History:  ?Diagnosis Date  ? Hypertension   ? Osteopenia   ? ?History reviewed. No pertinent surgical history. ?Patient Active Problem List  ? Diagnosis Date Noted  ? CVA (cerebral vascular accident) ([place]) 11/18/2021  ? Hypertensive urgency 11/18/2021  ? Leukocytosis 11/18/2021  ? Hyperlipidemia 11/18/2021  ? Osteopenia   ? ? ?REFERRING DIAG: 11/19/2021 (date of referral) ? ?THERAPY DIAG:  ?Unsteadiness on feet ? ?Other abnormalities of gait and mobility ? ?Other symptoms and signs involving the nervous system ? ?History of falling ? ?Hemiplegia and hemiparesis following cerebral infarction affecting left non-dominant side ([place]) ? ?PERTINENT HISTORY: PMH: HTN, osteoporosis  ?  ?76 year old female with history of hypertension admitted on 11/17/21 for left-sided weakness numbness, dizziness, nausea vomiting and imbalance for several days.  CT no acute abnormality.  MRI showed right pontine infarct.  CTA head and neck bilateral PCA moderate stenosis. She was inpatient in ER, asking for AMA, did not want to wait for formal PT evaluation. Discharged on 11/19/21 ? ?PRECAUTIONS: Fall, has a hx of nausea/motion sickness. (Pt brought in a bucket in case she got nauseous)  ? ?SUBJECTIVE: Last night I finally  did some sleeping.  Been having trouble with sleeping off and on.  Have been nauseous since getting out of car today.   ? ?PAIN:  ?Are you having pain? Yes: NPRS scale: 4/10 ?Pain location: Left shoulder ?Pain description: ache ?Aggravating factors: laying on it ?Relieving factors: "if I hadn't had a stroke it would've been better." ?Today's Vitals  ? 11/29/21 1407  ?BP: (!) 132/99  ?Pulse: 80  ? ?OBJECTIVE: (objective measures completed at initial evaluation unless otherwise dated) ? ? ?FUNCTIONAL TESTs:  ?5 times sit to stand: 13.25 seconds without UE support, performs with wide BOS ?10 meter walk test: 11.37 seconds with no AD = 2.88 ft/sec  ?TUG: 10.34 seconds with no AD  ?  ?  [facility] PT Assessment - 11/25/21 1430   ?  ?    ?     ?  Standardized Balance Assessment  ?  Standardized Balance Assessment Berg Balance Test   ?     ?  Berg Balance Test  ?  Sit to Stand Able to stand without using hands and stabilize independently   ?  Standing Unsupported Able to stand safely 2 minutes   ?  Sitting with Back Unsupported but Feet Supported on Floor or Stool Able to sit safely and securely 2 minutes   ?  Stand to Sit Sits safely with minimal use of hands   ?  Transfers Able to transfer safely, minor use of hands   ?  Standing Unsupported  with Eyes Closed Able to stand 10 seconds safely   ?  Standing Unsupported with Feet Together Able to place feet together independently and stand for 1 minute with supervision   ?  ?   ?  ?  ?   ?  ? ?  ?PATIENT SURVEYS:  ?FOTO 46 (predicted 46) ?  ?TODAY'S TREATMENT:  ? [facility] PT Assessment - 11/29/21 1418   ? ?  ? Berg Balance Test  ? Sit to Stand Able to stand without using hands and stabilize independently   ? Standing Unsupported Able to stand safely 2 minutes   ? Sitting with Back Unsupported but Feet Supported on Floor or Stool Able to sit safely and securely 2 minutes   ? Stand to Sit Sits safely with minimal use of hands   ? Transfers Able to transfer safely, minor use of hands   ?  Standing Unsupported with Eyes Closed Able to stand 10 seconds safely   ? Standing Unsupported with Feet Together Able to place feet together independently and stand for 1 minute with supervision   ? From Standing, Reach Forward with Outstretched Arm Can reach confidently >25 cm (10")   ? From Standing Position, Pick up Object from Floor Able to pick up shoe, needs supervision   ? From Standing Position, Turn to Look Behind Over each Shoulder Looks behind from both sides and weight shifts well   ? Turn 360 Degrees Able to turn 360 degrees safely one side only in 4 seconds or less   right side <4 sec  ? Standing Unsupported, Alternately Place Feet on Step/Stool Able to complete 4 steps without aid or supervision   ? Standing Unsupported, One Foot in Front Needs help to step but can hold 15 seconds   ? Standing on One Leg Tries to lift leg/unable to hold 3 seconds but remains standing independently   ? Total Score 45   ? ?  ?  ? ?  ? ?Initiated HEP.  See bolded below. ?  ?  ?PATIENT EDUCATION: ?Education details: Edu on BP parameters for exercise due to diastolic BP trending high today. ?Person educated: Patient and Spouse ?Education method: Explanation ?Education comprehension: verbalized understanding ?  ?  ?HOME EXERCISE PROGRAM: ?Access Code: [email or web address] ?URL: [email or web address] ?Date: 11/29/2021 ?Prepared by: Elease Etienne ? ?Exercises ?- Seated Hamstring Curls with Resistance  - 1 x daily - 5 x weekly - 2 sets - 10 reps ?- Supine Bridge  - 1 x daily - 5 x weekly - 2 sets - 10 reps ?- Seated Knee Extension with Resistance  - 1 x daily - 5 x weekly - 2 sets - 10 reps ?- Standing Single Leg Stance with Counter Support  - 1 x daily - 5 x weekly - 1 sets - 2 reps - 30 seconds hold ?- Standing Tandem Balance with Counter Support  - 1 x daily - 5 x weekly - 1 sets - 2 reps - 30 seconds hold ?- Standing March with Counter Support  - 1 x daily - 5 x weekly - 2 sets - 20 reps ? *Pt instructed to use ROM  comfortable on R knee for SLS/quad and hamstring exercises. ?  ?  ?GOALS: ?Goals reviewed with patient? Yes ?  ?SHORT TERM GOALS: Target date: 12/23/2021    ?Pt will be independent with initial HEP in order to build upon functional gains made in therapy. ?  ?Baseline: ?Goal status: INITIAL ?  ?2.  Pt will increase  BERG balance score to 50/56 to demonstrate improved static balance. ?Baseline: 45/56 11/29/2021 ?Goal status: INITIAL ?  ?3.  Pt will be able to go up and down 4 steps with alternating vs. Step to pattern and single handrail with supervision in order to use the stairs going in and out of the house. ?Baseline:  ?Goal status: INITIAL ?  ?4.  Pt will improve gait speed with LRAD vs. No AD to at least 3.1 ft/sec in order to demo improved community mobility.  ?  ?Baseline: 2.88 ft/sec, no AD ?Goal status: INITIAL ?  ?5.  Pt will undergo further assessment of FGA vs. DGI with LTG written in order to demo decr fall risk.      ?Baseline:  ?Goal status: INITIAL ?  ?6.  Pt will ambulate at least 200' over outdoor surfaces with no AD vs. LRAD w/ supervision in order to demo improved community mobility.  ?Baseline:  ?Goal status: INITIAL ?  ?LONG TERM GOALS: Target date: 01/20/2022   ?  ?Pt will be independent with final HEP in order to build upon functional gains made in therapy. ?  ?Baseline:  ?Goal status: INITIAL ?  ?2.  FGA/DGI goal to be written in order to demo decr fall risk. ?Baseline:  ?Goal status: INITIAL ?  ?3.  Pt will ambulate at least 500' over outdoor paved/grass surfaces with no AD vs. LRAD with mod I and perform a curb in order to demo improved community mobility. ?Baseline:  ?Goal status: INITIAL ?  ?4.  t will improve gait speed with LRAD vs. No AD to at least 3.4 ft/sec in order to demo improved community mobility.  ?Baseline: 2.88 ft/sec ?Goal status: INITIAL ?  ?5.  Pt will improve FOTO score to at least 65% in order to demo improved functional outcomes.  ?Baseline: 46 ?Goal status: INITIAL ?  ?   ?ASSESSMENT: ?  ?CLINICAL IMPRESSION: ?Finished assessing BERG with pt scoring 45/56 indicating high fall risk with STG updated to reflect current functional status.  She remains challenged by static bala

## 2021-12-02 ENCOUNTER — Encounter: Payer: Self-pay | Admitting: Physical Therapy

## 2021-12-02 ENCOUNTER — Ambulatory Visit: Payer: PPO | Admitting: Physical Therapy

## 2021-12-02 VITALS — BP 140/78 | HR 72

## 2021-12-02 DIAGNOSIS — R2681 Unsteadiness on feet: Secondary | ICD-10-CM | POA: Diagnosis not present

## 2021-12-02 DIAGNOSIS — R2689 Other abnormalities of gait and mobility: Secondary | ICD-10-CM

## 2021-12-02 NOTE — Therapy (Signed)
OUTPATIENT PHYSICAL THERAPY TREATMENT NOTE   Patient Name: Michelle Jacobson MRN: [id number] DOB:[birth year], 76 y.o., female 66 Date: 12/02/2021  PCP: Shon Baton, MD REFERRING PROVIDER: Lavina Hamman, MD (will be followed by Frann Rider, NP)  END OF SESSION:   PT End of Session - 12/02/21 1105     Visit Number 3    Number of Visits 17    Date for PT Re-Evaluation 02/23/22    Authorization Type [employer]    PT Start Time 1103    PT Stop Time 1150    PT Time Calculation (min) 47 min    Equipment Utilized During Treatment Gait belt    Activity Tolerance Patient tolerated treatment well    Behavior During Therapy WFL for tasks assessed/performed             Past Medical History:  Diagnosis Date   Hypertension    Osteopenia    History reviewed. No pertinent surgical history. Patient Active Problem List   Diagnosis Date Noted   CVA (cerebral vascular accident) ([facility]) 11/18/2021   Hypertensive urgency 11/18/2021   Leukocytosis 11/18/2021   Hyperlipidemia 11/18/2021   Osteopenia     REFERRING DIAG: 11/19/2021 (date of referral)  THERAPY DIAG:  Unsteadiness on feet  Other abnormalities of gait and mobility  PERTINENT HISTORY: PMH: HTN, osteoporosis .76 year old female with history of hypertension admitted on 11/17/21 for left-sided weakness numbness, dizziness, nausea vomiting and imbalance for several days.  CT no acute abnormality.  MRI showed right pontine infarct.  CTA head and neck bilateral PCA moderate stenosis. She was inpatient in ER, asking for AMA, did not want to wait for formal PT evaluation. Discharged on 11/19/21  PRECAUTIONS: Fall, has a hx of nausea/motion sickness. (Pt brought in a bucket in case she got nauseous)   SUBJECTIVE: No new complaints. No falls. Continues with HA  PAIN:  Are you having pain? Yes NPRS scale: 0/10 at rest, 4/10 at night Pain location: left shoulder Pain orientation: Left  PAIN TYPE: acute Pain  description: aching and sore   Aggravating factors: worse at night, most likely poor positioning Relieving factors: unsure, stretching at times  Blood pressure 140/78, pulse 72.   PATIENT SURVEYS:  FOTO 46 (predicted 37)     TODAY'S TREATMENT: 12/02/2021 GAIT   [facility] PT Assessment - 12/02/21 1121       Standardized Balance Assessment   Standardized Balance Assessment Dynamic Gait Index      Dynamic Gait Index   Level Surface Mild Impairment    Change in Gait Speed Severe Impairment   LOB with fast>slow speed change   Gait with Horizontal Head Turns Moderate Impairment    Gait with Vertical Head Turns Moderate Impairment    Gait and Pivot Turn Mild Impairment    Step Over Obstacle Mild Impairment    Step Around Obstacles Normal    Steps Mild Impairment   limited with descending due to right knee OA   Total Score 13    DGI comment: 13/24         BALANCE/NMR: Static Standing Balance: Surface: Airex Position: Narrow Base of Support Feet Hip Width Apart Completed with: Eyes Closed 30 seconds x 3 reps with feet together; then with feet hip width apart for eyes closed  Head Turns x 10 Reps and Head Nods x 10 Reps  Seated at edge of mat with feet on airex in parallel position/hip width apart- sit<>stands X 10 reps with no UE support, increased time needed,  min guard assist for balance.   Marland Kitchen    PATIENT EDUCATION: Education details: continue with current HEP; results of DGI performed today Person educated: Patient and Spouse Education method: Explanation Education comprehension: verbalized understanding     HOME EXERCISE PROGRAM: Access Code: [email or web address] URL: [email or web address] Date: 11/29/2021 Prepared by: Elease Etienne  Exercises - Seated Hamstring Curls with Resistance  - 1 x daily - 5 x weekly - 2 sets - 10 reps - Supine Bridge  - 1 x daily - 5 x weekly - 2 sets - 10 reps - Seated Knee Extension with Resistance  - 1 x daily - 5 x weekly - 2 sets  - 10 reps - Standing Single Leg Stance with Counter Support  - 1 x daily - 5 x weekly - 1 sets - 2 reps - 30 seconds hold - Standing Tandem Balance with Counter Support  - 1 x daily - 5 x weekly - 1 sets - 2 reps - 30 seconds hold - Standing March with Counter Support  - 1 x daily - 5 x weekly - 2 sets - 20 reps  *Pt instructed to use ROM comfortable on R knee for SLS/quad and hamstring exercises.     GOALS: Goals reviewed with patient? Yes   SHORT TERM GOALS: Target date: 12/23/2021    Pt will be independent with initial HEP in order to build upon functional gains made in therapy.   Baseline: Goal status: INITIAL   2.  Pt will increase BERG balance score to 50/56 to demonstrate improved static balance. Baseline: 45/56 11/29/2021 Goal status: INITIAL   3.  Pt will be able to go up and down 4 steps with alternating vs. Step to pattern and single handrail with supervision in order to use the stairs going in and out of the house. Baseline:  Goal status: INITIAL   4.  Pt will improve gait speed with LRAD vs. No AD to at least 3.1 ft/sec in order to demo improved community mobility.    Baseline: 2.88 ft/sec, no AD Goal status: INITIAL   5.  Pt will undergo further assessment of FGA vs. DGI with LTG written in order to demo decr fall risk.      Baseline:  Goal status: INITIAL   6.  Pt will ambulate at least 200' over outdoor surfaces with no AD vs. LRAD w/ supervision in order to demo improved community mobility.  Baseline:  Goal status: INITIAL   LONG TERM GOALS: Target date: 01/20/2022     Pt will be independent with final HEP in order to build upon functional gains made in therapy.   Baseline:  Goal status: INITIAL   2.  FGA/DGI goal to be written in order to demo decr fall risk. Baseline:  Goal status: INITIAL   3.  Pt will ambulate at least 500' over outdoor paved/grass surfaces with no AD vs. LRAD with mod I and perform a curb in order to demo improved community  mobility. Baseline:  Goal status: INITIAL   4.  t will improve gait speed with LRAD vs. No AD to at least 3.4 ft/sec in order to demo improved community mobility.  Baseline: 2.88 ft/sec Goal status: INITIAL   5.  Pt will improve FOTO score to at least 65% in order to demo improved functional outcomes.  Baseline: 46 Goal status: INITIAL     ASSESSMENT:   CLINICAL IMPRESSION: Skilled session initially focused on performance of DGI with pt scoring in the high fall risk  category with score of 13/24. Remainder of session focused on balance training with and without vision removed with up to min assist needed. Gait throughout session without device with min guard to min assist. No issues noted or reported in session. The pt is making steady progress and should benefit from continued PT to progress toward unmet goals.        OBJECTIVE IMPAIRMENTS Abnormal gait, decreased activity tolerance, decreased balance, decreased coordination, decreased endurance, decreased knowledge of use of DME, decreased strength, and postural dysfunction.    ACTIVITY LIMITATIONS community activity and driving.    PERSONAL FACTORS Behavior pattern, Past/current experiences, Time since onset of injury/illness/exacerbation, and 1-2 comorbidities: HTN, osteoporosis, HLD  are also affecting patient's functional outcome.      REHAB POTENTIAL: Good   CLINICAL DECISION MAKING: Evolving/moderate complexity   EVALUATION COMPLEXITY: Moderate   PLAN: PT FREQUENCY: 2x/week   PT DURATION: 12 weeks   PLANNED INTERVENTIONS: Therapeutic exercises, Therapeutic activity, Neuromuscular re-education, Balance training, Gait training, Patient/Family education, Stair training, Vestibular training, and DME instructions   PLAN FOR NEXT SESSION:  Monitor BP (check it manually as machine always reads really high for her); Add to HEP prn. Gait training with no device.  Continue to address tandem, SLS, reach to floor, turning, toe  taps.    Willow Ora, PTA, [place] [address] [place], [place] [address] [phone] 12/02/21, 11:55 AM

## 2021-12-05 ENCOUNTER — Encounter: Payer: Self-pay | Admitting: Adult Health

## 2021-12-06 ENCOUNTER — Encounter: Payer: Self-pay | Admitting: Physical Therapy

## 2021-12-06 ENCOUNTER — Ambulatory Visit: Payer: PPO | Admitting: Physical Therapy

## 2021-12-06 VITALS — BP 138/78

## 2021-12-06 DIAGNOSIS — R2689 Other abnormalities of gait and mobility: Secondary | ICD-10-CM

## 2021-12-06 DIAGNOSIS — Z9181 History of falling: Secondary | ICD-10-CM

## 2021-12-06 DIAGNOSIS — R2681 Unsteadiness on feet: Secondary | ICD-10-CM

## 2021-12-06 DIAGNOSIS — I69354 Hemiplegia and hemiparesis following cerebral infarction affecting left non-dominant side: Secondary | ICD-10-CM

## 2021-12-06 NOTE — Therapy (Unsigned)
OUTPATIENT PHYSICAL THERAPY TREATMENT NOTE   Patient Name: Michelle Jacobson MRN: [id number] DOB:09-Dec-1945, 76 y.o., female 9 Date: 12/06/2021  PCP: Shon Baton, MD REFERRING PROVIDER: Lavina Hamman, MD (will be followed by Frann Rider, NP)  END OF SESSION:   PT End of Session - 12/06/21 1535     Visit Number 4    Number of Visits 17    Date for PT Re-Evaluation 02/23/22    Authorization Type [employer]    PT Start Time 1532    PT Stop Time 1617    PT Time Calculation (min) 45 min    Equipment Utilized During Treatment Gait belt    Activity Tolerance Patient tolerated treatment well    Behavior During Therapy WFL for tasks assessed/performed             Past Medical History:  Diagnosis Date   Hypertension    Osteopenia    History reviewed. No pertinent surgical history. Patient Active Problem List   Diagnosis Date Noted   CVA (cerebral vascular accident) ([facility]) 11/18/2021   Hypertensive urgency 11/18/2021   Leukocytosis 11/18/2021   Hyperlipidemia 11/18/2021   Osteopenia     REFERRING DIAG: 11/19/2021 (date of referral)  THERAPY DIAG:  Unsteadiness on feet  Other abnormalities of gait and mobility  History of falling  Hemiplegia and hemiparesis following cerebral infarction affecting left non-dominant side (HCC)  PERTINENT HISTORY: PMH: HTN, osteoporosis .76 year old female with history of hypertension admitted on 11/17/21 for left-sided weakness numbness, dizziness, nausea vomiting and imbalance for several days.  CT no acute abnormality.  MRI showed right pontine infarct.  CTA head and neck bilateral PCA moderate stenosis. She was inpatient in ER, asking for AMA, did not want to wait for formal PT evaluation. Discharged on 11/19/21  PRECAUTIONS: Fall, has a hx of nausea/motion sickness. (Pt brought in a bucket in case she got nauseous)   SUBJECTIVE: No falls.  I've been in and out of the shower without issue.  She inquires about OT  referral asking to speak with Gwenyth Bouillon (time spent w/ Gwenyth Bouillon addressing issue w/ referral).   She further states she has been walking without RW in home.  PAIN:  Are you having pain? No NPRS scale: 0/10 at rest, 4/10 at night Pain location: left shoulder Pain orientation: Left  PAIN TYPE: acute Pain description: aching and sore   Aggravating factors: worse at night, most likely poor positioning Relieving factors: unsure, stretching at times  Blood pressure 138/78.   PATIENT SURVEYS:  FOTO 46 (predicted 77)     TODAY'S TREATMENT: GAIT: Gait pattern: step through pattern, trendelenburg, and wide BOS-left trendelenburg Distance walked: 690' Assistive device utilized: None Level of assistance: SBA and CGA Comments: pt distractible and mildly impulsive during task, no overt LOB but increased waivering from path while waving UE and turning head to talk to therapist and passers-by; cued for pacing as pt ambulates w/ inc speed  NMR Standing at EOM pt performs taps to cones in semi-circle x10 each direction; cued to slow pace with edu on increased speed and fall risk Standing alt toe taps to cone 2x20 Tandem walking 2x15' CGA, pt increasingly wobbly laterally with increased steps  THEREX: 6" forward stair step ups x15 each LE 2" heel taps progressing from BUE support to no UE support 2x10 each LE     PATIENT EDUCATION: Education details: continue with current HEP.  Discussed pacing her activities to slow down to decrease fall risk, fall prevention when rushing  to the bathroom, ambulating in home w/o AD as long as family member present.  Discussed using RW for long distances and in unfamiliar or unlevel environments. Person educated: Patient and Spouse Education method: Explanation Education comprehension: verbalized understanding     HOME EXERCISE PROGRAM: Access Code: [email or web address] URL: [email or web address] Date: 11/29/2021 Prepared by: Elease Etienne  Exercises - Seated Hamstring Curls with Resistance  - 1 x daily - 5 x weekly - 2 sets - 10 reps - Supine Bridge  - 1 x daily - 5 x weekly - 2 sets - 10 reps - Seated Knee Extension with Resistance  - 1 x daily - 5 x weekly - 2 sets - 10 reps - Standing Single Leg Stance with Counter Support  - 1 x daily - 5 x weekly - 1 sets - 2 reps - 30 seconds hold - Standing Tandem Balance with Counter Support  - 1 x daily - 5 x weekly - 1 sets - 2 reps - 30 seconds hold - Standing March with Counter Support  - 1 x daily - 5 x weekly - 2 sets - 20 reps  *Pt instructed to use ROM comfortable on R knee for SLS/quad and hamstring exercises.     GOALS: Goals reviewed with patient? Yes   SHORT TERM GOALS: Target date: 12/23/2021    Pt will be independent with initial HEP in order to build upon functional gains made in therapy.   Baseline: Goal status: INITIAL   2.  Pt will increase BERG balance score to 50/56 to demonstrate improved static balance. Baseline: 45/56 11/29/2021 Goal status: INITIAL   3.  Pt will be able to go up and down 4 steps with alternating vs. Step to pattern and single handrail with supervision in order to use the stairs going in and out of the house. Baseline:  Goal status: INITIAL   4.  Pt will improve gait speed with LRAD vs. No AD to at least 3.1 ft/sec in order to demo improved community mobility.    Baseline: 2.88 ft/sec, no AD Goal status: INITIAL   5.  Pt will undergo further assessment of FGA vs. DGI with LTG written in order to demo decr fall risk.      Baseline:  Goal status: INITIAL   6.  Pt will ambulate at least 200' over outdoor surfaces with no AD vs. LRAD w/ supervision in order to demo improved community mobility.  Baseline:  Goal status: INITIAL   LONG TERM GOALS: Target date: 01/20/2022     Pt will be independent with final HEP in order to build upon functional gains made in therapy.   Baseline:  Goal status: INITIAL   2.  FGA/DGI goal to  be written in order to demo decr fall risk. Baseline:  Goal status: INITIAL   3.  Pt will ambulate at least 500' over outdoor paved/grass surfaces with no AD vs. LRAD with mod I and perform a curb in order to demo improved community mobility. Baseline:  Goal status: INITIAL   4.  t will improve gait speed with LRAD vs. No AD to at least 3.4 ft/sec in order to demo improved community mobility.  Baseline: 2.88 ft/sec Goal status: INITIAL   5.  Pt will improve FOTO score to at least 65% in order to demo improved functional outcomes.  Baseline: 46 Goal status: INITIAL     ASSESSMENT:   CLINICAL IMPRESSION: Skilled session initiated with gait training without use of AD today with patient  mildly distractible, but managing well with task when focused and pacing herself appropriately.  She continues to be challenged by SLS activities, but tolerates functional quad and glut strengthening on step well.  Will continue to progress toward LTGs as able.       OBJECTIVE IMPAIRMENTS Abnormal gait, decreased activity tolerance, decreased balance, decreased coordination, decreased endurance, decreased knowledge of use of DME, decreased strength, and postural dysfunction.    ACTIVITY LIMITATIONS community activity and driving.    PERSONAL FACTORS Behavior pattern, Past/current experiences, Time since onset of injury/illness/exacerbation, and 1-2 comorbidities: HTN, osteoporosis, HLD  are also affecting patient's functional outcome.      REHAB POTENTIAL: Good   CLINICAL DECISION MAKING: Evolving/moderate complexity   EVALUATION COMPLEXITY: Moderate   PLAN: PT FREQUENCY: 2x/week   PT DURATION: 12 weeks   PLANNED INTERVENTIONS: Therapeutic exercises, Therapeutic activity, Neuromuscular re-education, Balance training, Gait training, Patient/Family education, Stair training, Vestibular training, and DME instructions   PLAN FOR NEXT SESSION:  Monitor BP (check it manually as machine always reads  really high for her); Add to HEP prn-add advanced strengthening/balance w/o UE support. Gait training with no device-try outside sidewalk/grass/incline.  Continue to address tandem, SLS, reach to floor, turning, toe taps.    Elease Etienne, PT, DPT Outpatient Neuro [facility] [address] [place], [place] [address] [phone] 12/06/21, 4:19 PM

## 2021-12-09 ENCOUNTER — Ambulatory Visit: Payer: PPO | Admitting: Physical Therapy

## 2021-12-09 ENCOUNTER — Encounter: Payer: Self-pay | Admitting: Physical Therapy

## 2021-12-09 VITALS — BP 130/84

## 2021-12-09 DIAGNOSIS — R2681 Unsteadiness on feet: Secondary | ICD-10-CM | POA: Diagnosis not present

## 2021-12-09 DIAGNOSIS — I69354 Hemiplegia and hemiparesis following cerebral infarction affecting left non-dominant side: Secondary | ICD-10-CM

## 2021-12-09 DIAGNOSIS — R2689 Other abnormalities of gait and mobility: Secondary | ICD-10-CM

## 2021-12-09 DIAGNOSIS — Z9181 History of falling: Secondary | ICD-10-CM

## 2021-12-09 NOTE — Therapy (Signed)
OUTPATIENT PHYSICAL THERAPY TREATMENT NOTE   Patient Name: Michelle Jacobson MRN: [id number] DOB:1945/12/01, 76 y.o., female 46 Date: 12/10/2021  PCP: Shon Baton, MD REFERRING PROVIDER: Lavina Hamman, MD (will be followed by Frann Rider, NP)  END OF SESSION:   PT End of Session - 12/09/21 1411     Visit Number 5    Number of Visits 17    Date for PT Re-Evaluation 02/23/22    Authorization Type [employer]    PT Start Time 1404    PT Stop Time 1446    PT Time Calculation (min) 42 min    Equipment Utilized During Treatment Gait belt    Activity Tolerance Patient tolerated treatment well    Behavior During Therapy WFL for tasks assessed/performed             Past Medical History:  Diagnosis Date   Hypertension    Osteopenia    History reviewed. No pertinent surgical history. Patient Active Problem List   Diagnosis Date Noted   CVA (cerebral vascular accident) ([facility]) 11/18/2021   Hypertensive urgency 11/18/2021   Leukocytosis 11/18/2021   Hyperlipidemia 11/18/2021   Osteopenia     REFERRING DIAG: 11/19/2021 (date of referral)  THERAPY DIAG:  Unsteadiness on feet  Other abnormalities of gait and mobility  History of falling  Hemiplegia and hemiparesis following cerebral infarction affecting left non-dominant side (HCC)  PERTINENT HISTORY: PMH: HTN, osteoporosis .76 year old female with history of hypertension admitted on 11/17/21 for left-sided weakness numbness, dizziness, nausea vomiting and imbalance for several days.  CT no acute abnormality.  MRI showed right pontine infarct.  CTA head and neck bilateral PCA moderate stenosis. She was inpatient in ER, asking for AMA, did not want to wait for formal PT evaluation. Discharged on 11/19/21  PRECAUTIONS: Fall, has a hx of nausea/motion sickness. (Pt brought in a bucket in case she got nauseous)   SUBJECTIVE: She is still doing HEP and it is still challenging where she needs her hands on the  counter for balance.  PAIN:  Are you having pain? Yes NPRS scale: 4/10 Pain location: hip Pain orientation: Left  PAIN TYPE: acute Pain description: aching and sore   Aggravating factors: unsure Relieving factors: unsure  Blood pressure 130/84.   PATIENT SURVEYS:  FOTO 46 (predicted 20)   TODAY'S TREATMENT:  NMR:  Lateral stepping on grass 2x25'  Backwards walking on grass x25'  Standing feet together on incline EO/EC x26mn each  Semi tandem on incline 2x135m each LE behind  RAMP:  Level of Assistance: SBA Assistive device utilized: None Ramp Comments: Pt ambulates up incline x3 on outdoor sidewalk surface and indoor x1  CURB:  Level of Assistance: SBA Assistive device utilized: None Curb Comments: Pt performs forward facing step up and step down from sidewalk curb x1 each and then side step up onto curb x1  GAIT: Gait pattern: step through pattern, trendelenburg, lateral lean- Right, lateral lean- Left, and narrow BOS Distance walked: 700' + 10' + 20' + 150' + 8'x6 (gravel and mulch) Assistive device utilized: None Level of assistance: SBA and CGA Comments: Pt ambulates initial bout over sidewalk around building then into parking lot to practice surface transitions and unlevel ground.  Additional bouts performed over grass, gravel and mulch (3 bouts each).  CGA used for mulch, gravel, and initial curb step.  She does well managing curb steps and surface changes, but continues to waiver when looking in side-to-side but states that is baseline for her.  PATIENT EDUCATION: Education details: Discussed continued use of AD as needed for transitional surfaces or unfamiliar/unlevel environments. Person educated: Patient and Spouse Education method: Explanation Education comprehension: verbalized understanding     HOME EXERCISE PROGRAM: Access Code: [email or web address] URL: [email or web address] Date: 11/29/2021 Prepared by: Elease Etienne  Exercises -  Seated Hamstring Curls with Resistance  - 1 x daily - 5 x weekly - 2 sets - 10 reps - Supine Bridge  - 1 x daily - 5 x weekly - 2 sets - 10 reps - Seated Knee Extension with Resistance  - 1 x daily - 5 x weekly - 2 sets - 10 reps - Standing Single Leg Stance with Counter Support  - 1 x daily - 5 x weekly - 1 sets - 2 reps - 30 seconds hold - Standing Tandem Balance with Counter Support  - 1 x daily - 5 x weekly - 1 sets - 2 reps - 30 seconds hold - Standing March with Counter Support  - 1 x daily - 5 x weekly - 2 sets - 20 reps  *Pt instructed to use ROM comfortable on R knee for SLS/quad and hamstring exercises.     GOALS: Goals reviewed with patient? Yes   SHORT TERM GOALS: Target date: 12/23/2021    Pt will be independent with initial HEP in order to build upon functional gains made in therapy.   Baseline: Goal status: INITIAL   2.  Pt will increase BERG balance score to 50/56 to demonstrate improved static balance. Baseline: 45/56 11/29/2021 Goal status: INITIAL   3.  Pt will be able to go up and down 4 steps with alternating vs. Step to pattern and single handrail with supervision in order to use the stairs going in and out of the house. Baseline:  Goal status: INITIAL   4.  Pt will improve gait speed with LRAD vs. No AD to at least 3.1 ft/sec in order to demo improved community mobility.    Baseline: 2.88 ft/sec, no AD Goal status: INITIAL   5.  Pt will undergo further assessment of FGA vs. DGI with LTG written in order to demo decr fall risk.      Baseline:  Goal status: INITIAL   6.  Pt will ambulate at least 200' over outdoor surfaces with no AD vs. LRAD w/ supervision in order to demo improved community mobility.  Baseline:  Goal status: INITIAL   LONG TERM GOALS: Target date: 01/20/2022     Pt will be independent with final HEP in order to build upon functional gains made in therapy.   Baseline:  Goal status: INITIAL   2.  FGA/DGI goal to be written in order to  demo decr fall risk. Baseline:  Goal status: INITIAL   3.  Pt will ambulate at least 500' over outdoor paved/grass surfaces with no AD vs. LRAD with mod I and perform a curb in order to demo improved community mobility. Baseline:  Goal status: INITIAL   4.  t will improve gait speed with LRAD vs. No AD to at least 3.4 ft/sec in order to demo improved community mobility.  Baseline: 2.88 ft/sec Goal status: INITIAL   5.  Pt will improve FOTO score to at least 65% in order to demo improved functional outcomes.  Baseline: 46 Goal status: INITIAL     ASSESSMENT:   CLINICAL IMPRESSION: Skilled session focused on addressing gait training without AD in unlevel outdoor environment to promote improved community ambulation.  She does well with  inclines and curb steps as well as surface transitions this session.  Will continue to address this further in coming session per POC.       OBJECTIVE IMPAIRMENTS Abnormal gait, decreased activity tolerance, decreased balance, decreased coordination, decreased endurance, decreased knowledge of use of DME, decreased strength, and postural dysfunction.    ACTIVITY LIMITATIONS community activity and driving.    PERSONAL FACTORS Behavior pattern, Past/current experiences, Time since onset of injury/illness/exacerbation, and 1-2 comorbidities: HTN, osteoporosis, HLD  are also affecting patient's functional outcome.      REHAB POTENTIAL: Good   CLINICAL DECISION MAKING: Evolving/moderate complexity   EVALUATION COMPLEXITY: Moderate   PLAN: PT FREQUENCY: 2x/week   PT DURATION: 12 weeks   PLANNED INTERVENTIONS: Therapeutic exercises, Therapeutic activity, Neuromuscular re-education, Balance training, Gait training, Patient/Family education, Stair training, Vestibular training, and DME instructions   PLAN FOR NEXT SESSION:  Monitor BP (check it manually as machine always reads really high for her); Add to HEP prn-add advanced strengthening/balance w/o  UE support. Gait training with no device-continue outside sidewalk/grass/incline.  Continue to address tandem, SLS, reach to floor, turning, toe taps.    Elease Etienne, PT, DPT Outpatient Neuro [facility] [address], [place], [place] [address] [phone] 12/10/21, 9:21 AM

## 2021-12-15 ENCOUNTER — Encounter: Payer: Self-pay | Admitting: Physical Therapy

## 2021-12-15 ENCOUNTER — Ambulatory Visit: Payer: PPO | Admitting: Physical Therapy

## 2021-12-15 VITALS — BP 130/76

## 2021-12-15 DIAGNOSIS — R2681 Unsteadiness on feet: Secondary | ICD-10-CM

## 2021-12-15 DIAGNOSIS — R2689 Other abnormalities of gait and mobility: Secondary | ICD-10-CM

## 2021-12-15 DIAGNOSIS — I69354 Hemiplegia and hemiparesis following cerebral infarction affecting left non-dominant side: Secondary | ICD-10-CM

## 2021-12-15 DIAGNOSIS — Z9181 History of falling: Secondary | ICD-10-CM

## 2021-12-15 NOTE — Therapy (Signed)
OUTPATIENT PHYSICAL THERAPY TREATMENT NOTE   Patient Name: Michelle Jacobson MRN: [id number] DOB:1945-09-18, 76 y.o., female 58 Date: 12/15/2021  PCP: Shon Baton, MD REFERRING PROVIDER: Lavina Hamman, MD (will be followed by Frann Rider, NP)  END OF SESSION:   PT End of Session - 12/15/21 1320     Visit Number 6    Number of Visits 17    Date for PT Re-Evaluation 02/23/22    Authorization Type [employer]    PT Start Time 1318    PT Stop Time 1400    PT Time Calculation (min) 42 min    Equipment Utilized During Treatment Gait belt    Activity Tolerance Patient tolerated treatment well    Behavior During Therapy WFL for tasks assessed/performed             Past Medical History:  Diagnosis Date   Hypertension    Osteopenia    History reviewed. No pertinent surgical history. Patient Active Problem List   Diagnosis Date Noted   CVA (cerebral vascular accident) ([place]) 11/18/2021   Hypertensive urgency 11/18/2021   Leukocytosis 11/18/2021   Hyperlipidemia 11/18/2021   Osteopenia     REFERRING DIAG: 11/19/2021 (date of referral)  THERAPY DIAG:  Unsteadiness on feet  Other abnormalities of gait and mobility  History of falling  Hemiplegia and hemiparesis following cerebral infarction affecting left non-dominant side (HCC)  PERTINENT HISTORY: PMH: HTN, osteoporosis .76 year old female with history of hypertension admitted on 11/17/21 for left-sided weakness numbness, dizziness, nausea vomiting and imbalance for several days.  CT no acute abnormality.  MRI showed right pontine infarct.  CTA head and neck bilateral PCA moderate stenosis. She was inpatient in ER, asking for AMA, did not want to wait for formal PT evaluation. Discharged on 11/19/21  PRECAUTIONS: Fall, has a hx of nausea/motion sickness. (Pt brought in a bucket in case she got nauseous)   SUBJECTIVE: No new complaints. One near fall, caught herself on the doorway. Says she turned too  fast.   PAIN:  Are you having pain? No     PATIENT SURVEYS:  FOTO 46 (predicted 65)  Vitals:   12/15/21 1320  BP: 130/76      TODAY'S TREATMENT: 12/15/2021  GAIT: Gait pattern: step through pattern, trendelenburg, lateral lean- Right, lateral lean- Left, and narrow BOS Distance walked: 1000 x 1, plus around clinic with session Assistive device utilized: None Level of assistance:  supervision to min guard assist Comments: Gait over indoor level surfaces and outdoor paved, gravel and grass surfaces with up to min guard assist. Minor veering noted at times and cues to slow down for safety needed.   BALANCE/NMR: On red/blue mats next to counter top: heel walking forward/backward, toe walking forward/backward, then tandem gait forward/backwards for 3 laps each with single UE support on counter top. Min guard assist for safety.  Rockerboard: performed both ways on balance board- EO rocking the board with emphasis on tall posture/weight shifting, then holding steady for EC 30 seconds x 3 reps. Min guard to min assist with no UE support, cues on weight shifting to assist with balance.         PATIENT EDUCATION: Education details: continue with current HEP; continue to use assistive device for long distance gait and outdoor surfaces for safety at this time Person educated: Patient and Spouse Education method: Explanation Education comprehension: verbalized understanding     HOME EXERCISE PROGRAM: Access Code: [email or web address] URL: [email or web address] Date: 11/29/2021 Prepared by: Elease Etienne  Exercises -  Seated Hamstring Curls with Resistance  - 1 x daily - 5 x weekly - 2 sets - 10 reps - Supine Bridge  - 1 x daily - 5 x weekly - 2 sets - 10 reps - Seated Knee Extension with Resistance  - 1 x daily - 5 x weekly - 2 sets - 10 reps - Standing Single Leg Stance with Counter Support  - 1 x daily - 5 x weekly - 1 sets - 2 reps - 30 seconds hold - Standing Tandem  Balance with Counter Support  - 1 x daily - 5 x weekly - 1 sets - 2 reps - 30 seconds hold - Standing March with Counter Support  - 1 x daily - 5 x weekly - 2 sets - 20 reps  *Pt instructed to use ROM comfortable on R knee for SLS/quad and hamstring exercises.     GOALS: Goals reviewed with patient? Yes   SHORT TERM GOALS: Target date: 12/23/2021    Pt will be independent with initial HEP in order to build upon functional gains made in therapy. Baseline: Goal status: INITIAL   2.  Pt will increase BERG balance score to 50/56 to demonstrate improved static balance. Baseline: 45/56 11/29/2021 Goal status: INITIAL   3.  Pt will be able to go up and down 4 steps with alternating vs. Step to pattern and single handrail with supervision in order to use the stairs going in and out of the house. Baseline:  Goal status: INITIAL   4.  Pt will improve gait speed with LRAD vs. No AD to at least 3.1 ft/sec in order to demo improved community mobility.  Baseline: 2.88 ft/sec, no AD Goal status: INITIAL   5.  Pt will undergo further assessment of FGA vs. DGI with LTG written in order to demo decr fall risk.      Baseline: 12/15/21: 13/24 scored as baseline Goal status: INITIAL   6.  Pt will ambulate at least 200' over outdoor surfaces with no AD vs. LRAD w/ supervision in order to demo improved community mobility.  Baseline:  Goal status: INITIAL   LONG TERM GOALS: Target date: 01/20/2022     Pt will be independent with final HEP in order to build upon functional gains made in therapy. Baseline:  Goal status: INITIAL   2.  FGA/DGI goal to be written in order to demo decr fall risk. Baseline: 12/02/21: 13/24 scored as baseline Goal status: INITIAL   3.  Pt will ambulate at least 500' over outdoor paved/grass surfaces with no AD vs. LRAD with mod I and perform a curb in order to demo improved community mobility. Baseline:  Goal status: INITIAL   4.  t will improve gait speed with LRAD vs. No  AD to at least 3.4 ft/sec in order to demo improved community mobility.  Baseline: 2.88 ft/sec Goal status: INITIAL   5.  Pt will improve FOTO score to at least 65% in order to demo improved functional outcomes.  Baseline: 46 Goal status: INITIAL     ASSESSMENT:   CLINICAL IMPRESSION: Skilled session focused on gait with no device on various surfaces and balance training on compliant surfaces. Up to min assist needed at times with veering noted with gait on unlevel surfaces. Advised pt to continue with use of device outdoors and for longer distances at this time. Pt does have improved balance when slowed down. The pt is making progress and should benefit from continued PT to progress toward unmet  goals.       OBJECTIVE IMPAIRMENTS Abnormal gait, decreased activity tolerance, decreased balance, decreased coordination, decreased endurance, decreased knowledge of use of DME, decreased strength, and postural dysfunction.    ACTIVITY LIMITATIONS community activity and driving.    PERSONAL FACTORS Behavior pattern, Past/current experiences, Time since onset of injury/illness/exacerbation, and 1-2 comorbidities: HTN, osteoporosis, HLD  are also affecting patient's functional outcome.      REHAB POTENTIAL: Good   CLINICAL DECISION MAKING: Evolving/moderate complexity   EVALUATION COMPLEXITY: Moderate   PLAN: PT FREQUENCY: 2x/week   PT DURATION: 12 weeks   PLANNED INTERVENTIONS: Therapeutic exercises, Therapeutic activity, Neuromuscular re-education, Balance training, Gait training, Patient/Family education, Stair training, Vestibular training, and DME instructions   PLAN FOR NEXT SESSION:  Monitor BP (check it manually as machine always reads really high for her); PT to update DGI goals.  Add to HEP prn-add advanced strengthening/balance w/o UE support. Gait training with no device-continue outside sidewalk/grass/incline.  Continue to address tandem, SLS, reach to floor, turning, toe  taps.    Willow Ora, PTA, [facility] [address], [place], [place] [address] [phone] 12/15/21, 10:26 PM

## 2021-12-17 ENCOUNTER — Encounter: Payer: Self-pay | Admitting: Physical Therapy

## 2021-12-17 ENCOUNTER — Ambulatory Visit: Payer: PPO | Attending: Internal Medicine | Admitting: Physical Therapy

## 2021-12-17 VITALS — BP 130/70

## 2021-12-17 DIAGNOSIS — I69354 Hemiplegia and hemiparesis following cerebral infarction affecting left non-dominant side: Secondary | ICD-10-CM | POA: Diagnosis not present

## 2021-12-17 DIAGNOSIS — Z9181 History of falling: Secondary | ICD-10-CM | POA: Diagnosis not present

## 2021-12-17 DIAGNOSIS — R29818 Other symptoms and signs involving the nervous system: Secondary | ICD-10-CM | POA: Diagnosis not present

## 2021-12-17 DIAGNOSIS — R2681 Unsteadiness on feet: Secondary | ICD-10-CM | POA: Diagnosis not present

## 2021-12-17 DIAGNOSIS — R2689 Other abnormalities of gait and mobility: Secondary | ICD-10-CM | POA: Diagnosis not present

## 2021-12-17 NOTE — Therapy (Signed)
OUTPATIENT PHYSICAL THERAPY TREATMENT NOTE   Patient Name: Michelle Jacobson MRN: [id number] DOB:[birth year], 76 y.o., female Today's Date: 12/17/2021  PCP: Shon Baton, MD REFERRING PROVIDER: Lavina Hamman, MD (will be followed by Frann Rider, NP)  END OF SESSION:   PT End of Session - 12/17/21 1400     Visit Number 7    Number of Visits 17    Date for PT Re-Evaluation 02/23/22    Authorization Type [employer]    PT Start Time 1359    PT Stop Time 1442    PT Time Calculation (min) 43 min    Equipment Utilized During Treatment Gait belt    Activity Tolerance Patient tolerated treatment well    Behavior During Therapy WFL for tasks assessed/performed             Past Medical History:  Diagnosis Date   Hypertension    Osteopenia    History reviewed. No pertinent surgical history. Patient Active Problem List   Diagnosis Date Noted   CVA (cerebral vascular accident) ([place]) 11/18/2021   Hypertensive urgency 11/18/2021   Leukocytosis 11/18/2021   Hyperlipidemia 11/18/2021   Osteopenia     REFERRING DIAG: 11/19/2021 (date of referral)  THERAPY DIAG:  Unsteadiness on feet  Other abnormalities of gait and mobility  History of falling  Other symptoms and signs involving the nervous system  PERTINENT HISTORY: PMH: HTN, osteoporosis .76 year old female with history of hypertension admitted on 11/17/21 for left-sided weakness numbness, dizziness, nausea vomiting and imbalance for several days.  CT no acute abnormality.  MRI showed right pontine infarct.  CTA head and neck bilateral PCA moderate stenosis. She was inpatient in ER, asking for AMA, did not want to wait for formal PT evaluation. Discharged on 11/19/21  PRECAUTIONS: Fall, has a hx of nausea/motion sickness. (Pt brought in a bucket in case she got nauseous)   SUBJECTIVE: No falls. Went to the swim meet last night and did some walking on some unlevel surfaces. Does not want to walk outside due to  the heat.   PAIN:  Are you having pain? No  PATIENT SURVEYS:  FOTO 46 (predicted 65)  Vitals:   12/17/21 1404  BP: 130/70       TODAY'S TREATMENT:   GAIT: Gait pattern: step through pattern, trendelenburg, lateral lean- Right, lateral lean- Left, and narrow BOS Distance walked: Around clinic with session Assistive device utilized: None Level of assistance:  supervision   BALANCE/NMR: Single Leg Stance: On blue foam beam; alternating SLS taps forwards to 2 cones x10 reps each side, then cross body cone taps x10 reps, incr postural sway at times and intermittent UE support for balance  Side Stepping: On blue foam beam; down and back x3 reps, cues for incr foot clearance and marching for SLS  Tandem Walking: Forwards down and back x3 reps on blue foam beam, cues for slowed pace, intermittent UE support   On rockerboard in A/P direction:  -Alternating step ups and marching contralateral leg for dynamic SLS, performed x7 reps each sides, pt at times almost losing balance laterally and leaning outside of // bars and needing UE support. Pt needing to stop this activity due to incr R knee pain.   -EO with trying to keep board level, x10 reps head turns, x10 reps head nods, incr postural sway at times.   On blue mat: -with 4 smaller orange obstacles, alternating pattern down and back x3 reps, cues for slowed pace for incr dynamic SLS time.  Intermittent UE support   -forward and retro gait with EC down and back x2 reps, decr step length when ambulating backwards           PATIENT EDUCATION: Education details: Continue with current HEP. Continue to use RW for outdoor surfaces.  Person educated: Patient  Education method: Explanation Education comprehension: verbalized understanding     HOME EXERCISE PROGRAM: Access Code: [email or web address] URL: [email or web address] Date: 11/29/2021 Prepared by: Elease Etienne  Exercises - Seated Hamstring Curls with Resistance   - 1 x daily - 5 x weekly - 2 sets - 10 reps - Supine Bridge  - 1 x daily - 5 x weekly - 2 sets - 10 reps - Seated Knee Extension with Resistance  - 1 x daily - 5 x weekly - 2 sets - 10 reps - Standing Single Leg Stance with Counter Support  - 1 x daily - 5 x weekly - 1 sets - 2 reps - 30 seconds hold - Standing Tandem Balance with Counter Support  - 1 x daily - 5 x weekly - 1 sets - 2 reps - 30 seconds hold - Standing March with Counter Support  - 1 x daily - 5 x weekly - 2 sets - 20 reps  *Pt instructed to use ROM comfortable on R knee for SLS/quad and hamstring exercises.     GOALS: Goals reviewed with patient? Yes   SHORT TERM GOALS: Target date: 12/23/2021    Pt will be independent with initial HEP in order to build upon functional gains made in therapy. Baseline: Goal status: INITIAL   2.  Pt will increase BERG balance score to 50/56 to demonstrate improved static balance. Baseline: 45/56 11/29/2021 Goal status: INITIAL   3.  Pt will be able to go up and down 4 steps with alternating vs. Step to pattern and single handrail with supervision in order to use the stairs going in and out of the house. Baseline:  Goal status: INITIAL   4.  Pt will improve gait speed with LRAD vs. No AD to at least 3.1 ft/sec in order to demo improved community mobility.  Baseline: 2.88 ft/sec, no AD Goal status: INITIAL   5.  Pt will undergo further assessment of FGA vs. DGI with LTG written in order to demo decr fall risk.      Baseline: 12/15/21: 13/24 scored as baseline Goal status: INITIAL   6.  Pt will ambulate at least 200' over outdoor surfaces with no AD vs. LRAD w/ supervision in order to demo improved community mobility.  Baseline:  Goal status: INITIAL   LONG TERM GOALS: Target date: 01/20/2022     Pt will be independent with final HEP in order to build upon functional gains made in therapy. Baseline:  Goal status: INITIAL   2.  Pt will improve DGI to at least a 17/24 in order to demo  decr fall risk.  Baseline: 12/02/21: 13/24 scored as baseline Goal status: REVISED   3.  Pt will ambulate at least 500' over outdoor paved/grass surfaces with no AD vs. LRAD with mod I and perform a curb in order to demo improved community mobility. Baseline:  Goal status: INITIAL   4.  Pt will improve gait speed with LRAD vs. No AD to at least 3.4 ft/sec in order to demo improved community mobility.  Baseline: 2.88 ft/sec Goal status: INITIAL   5.  Pt will improve FOTO score to at least 65% in order to demo improved functional outcomes.  Baseline: 46 Goal status: INITIAL     ASSESSMENT:   CLINICAL IMPRESSION: Skilled session continued to focus on balance on compliant surfaces working on narrow BOS, SLS, weight shifting, and head motions. Pt needing cues at times to slow down esp with SLS tasks, as pt does have better balance when she slows down. Will continue to progress towards LTGs.     OBJECTIVE IMPAIRMENTS Abnormal gait, decreased activity tolerance, decreased balance, decreased coordination, decreased endurance, decreased knowledge of use of DME, decreased strength, and postural dysfunction.    ACTIVITY LIMITATIONS community activity and driving.    PERSONAL FACTORS Behavior pattern, Past/current experiences, Time since onset of injury/illness/exacerbation, and 1-2 comorbidities: HTN, osteoporosis, HLD  are also affecting patient's functional outcome.      REHAB POTENTIAL: Good   CLINICAL DECISION MAKING: Evolving/moderate complexity   EVALUATION COMPLEXITY: Moderate   PLAN: PT FREQUENCY: 2x/week   PT DURATION: 12 weeks   PLANNED INTERVENTIONS: Therapeutic exercises, Therapeutic activity, Neuromuscular re-education, Balance training, Gait training, Patient/Family education, Stair training, Vestibular training, and DME instructions   PLAN FOR NEXT SESSION:  LTGs due next week.   Monitor BP (check it manually as machine always reads really high for her);   Add to HEP  prn-add advanced strengthening/balance w/o UE support. Gait training with no device-continue outside sidewalk/grass/incline.  Continue to address tandem, SLS, reach to floor, turning, toe taps.   Janann August, PT, DPT 12/17/21 2:47 PM   [phone] 12/17/21, 2:47 PM

## 2021-12-20 ENCOUNTER — Ambulatory Visit: Payer: PPO | Admitting: Physical Therapy

## 2021-12-20 ENCOUNTER — Encounter: Payer: Self-pay | Admitting: Physical Therapy

## 2021-12-20 VITALS — BP 118/70

## 2021-12-20 DIAGNOSIS — R29818 Other symptoms and signs involving the nervous system: Secondary | ICD-10-CM

## 2021-12-20 DIAGNOSIS — Z9181 History of falling: Secondary | ICD-10-CM

## 2021-12-20 DIAGNOSIS — R2689 Other abnormalities of gait and mobility: Secondary | ICD-10-CM

## 2021-12-20 DIAGNOSIS — R2681 Unsteadiness on feet: Secondary | ICD-10-CM | POA: Diagnosis not present

## 2021-12-20 NOTE — Therapy (Signed)
OUTPATIENT PHYSICAL THERAPY TREATMENT NOTE   Patient Name: Michelle Jacobson MRN: [id number] DOB:1945-09-15, 76 y.o., female Today's Date: 12/20/2021  PCP: Shon Baton, MD REFERRING PROVIDER: Lavina Hamman, MD (will be followed by Frann Rider, NP)  END OF SESSION:   PT End of Session - 12/20/21 1320     Visit Number 8    Number of Visits 17    Date for PT Re-Evaluation 02/23/22    Authorization Type [employer]ICARE    PT Start Time 1319    Equipment Utilized During Treatment Gait belt    Activity Tolerance Patient tolerated treatment well    Behavior During Therapy [facility] for tasks assessed/performed             Past Medical History:  Diagnosis Date   Hypertension    Osteopenia    History reviewed. No pertinent surgical history. Patient Active Problem List   Diagnosis Date Noted   CVA (cerebral vascular accident) ([place]) 11/18/2021   Hypertensive urgency 11/18/2021   Leukocytosis 11/18/2021   Hyperlipidemia 11/18/2021   Osteopenia     REFERRING DIAG: 11/19/2021 (date of referral)  THERAPY DIAG:  Unsteadiness on feet  Other abnormalities of gait and mobility  Other symptoms and signs involving the nervous system  History of falling  PERTINENT HISTORY: PMH: HTN, osteoporosis .76 year old female with history of hypertension admitted on 11/17/21 for left-sided weakness numbness, dizziness, nausea vomiting and imbalance for several days.  CT no acute abnormality.  MRI showed right pontine infarct.  CTA head and neck bilateral PCA moderate stenosis. She was inpatient in ER, asking for AMA, did not want to wait for formal PT evaluation. Discharged on 11/19/21  PRECAUTIONS: Fall, has a hx of nausea/motion sickness. (Pt brought in a bucket in case she got nauseous)   SUBJECTIVE: Did not have a good day yesterday. Had diarrhea but is feeling better today. Feels like she is not gonna pick her feet up well.   PAIN:  Are you having pain? No  PATIENT SURVEYS:   FOTO 46 (predicted 65)  Vitals:   12/20/21 1325  BP: 118/70        TODAY'S TREATMENT:  [facility] PT Assessment - 12/20/21 1322       Berg Balance Test   Sit to Stand Able to stand without using hands and stabilize independently    Standing Unsupported Able to stand safely 2 minutes    Sitting with Back Unsupported but Feet Supported on Floor or Stool Able to sit safely and securely 2 minutes    Stand to Sit Sits safely with minimal use of hands    Transfers Able to transfer safely, minor use of hands    Standing Unsupported with Eyes Closed Able to stand 10 seconds safely    Standing Unsupported with Feet Together Able to place feet together independently and stand 1 minute safely    From Standing, Reach Forward with Outstretched Arm Can reach confidently >25 cm (10")    From Standing Position, Pick up Object from Floor Able to pick up shoe safely and easily    From Standing Position, Turn to Look Behind Over each Shoulder Looks behind from both sides and weight shifts well    Turn 360 Degrees Able to turn 360 degrees safely in 4 seconds or less    Standing Unsupported, Alternately Place Feet on Step/Stool Able to stand independently and safely and complete 8 steps in 20 seconds    Standing Unsupported, One Foot in [place] to place  foot tandem independently and hold 30 seconds    Standing on One Leg Able to lift leg independently and hold 5-10 seconds    Total Score 55    Berg comment: 55/56 = lower fall risk              GAIT: Gait pattern: step through pattern, trendelenburg, lateral lean- Right, lateral lean- Left, and narrow BOS Distance walked: 800' outdoors over grass, pavement, and level surfaces indoor, 230' x 1  Assistive device utilized: None and SPC  Level of assistance:  supervision  Supervision when on grass, pavement, and level indoor surfaces with no AD. Pt demonstrating improvements with ambulating on grass. Pt needing CGA when performing head turns and  head nods on grass, esp with head turns.   Performed curb x2 reps with no AD with supervision.   Performed 2 laps with [facility] with supervision indoors, with initial cues for sequencing, but pt able to pick up well.    STAIRS:  Level of Assistance: SBA  Stair Negotiation Technique: Alternating Pattern  Forwards with No Rails Single Rail on Right  Number of Stairs: 4 x 2 reps   Height of Stairs: 6  Comments: Pt able to perform with no handrail with supervision, but did educate to still continue to use railing at all time for safety.           PATIENT EDUCATION: Education details: Pt does not have to use RW when coming in to therapy anymore - plans to bring in her [facility] next time from home, continue using RW/AD for longer outside distances/unfamiliar environments or when pt is more tired.  Person educated: Patient  Education method: Explanation Education comprehension: verbalized understanding     HOME EXERCISE PROGRAM: Access Code: [email or web address] URL: [email or web address] Date: 11/29/2021 Prepared by: Elease Etienne  Exercises - Seated Hamstring Curls with Resistance  - 1 x daily - 5 x weekly - 2 sets - 10 reps - Supine Bridge  - 1 x daily - 5 x weekly - 2 sets - 10 reps - Seated Knee Extension with Resistance  - 1 x daily - 5 x weekly - 2 sets - 10 reps - Standing Single Leg Stance with Counter Support  - 1 x daily - 5 x weekly - 1 sets - 2 reps - 30 seconds hold - Standing Tandem Balance with Counter Support  - 1 x daily - 5 x weekly - 1 sets - 2 reps - 30 seconds hold - Standing March with Counter Support  - 1 x daily - 5 x weekly - 2 sets - 20 reps  *Pt instructed to use ROM comfortable on R knee for SLS/quad and hamstring exercises.     GOALS: Goals reviewed with patient? Yes   SHORT TERM GOALS: Target date: 12/23/2021    Pt will be independent with initial HEP in order to build upon functional gains made in therapy. Baseline: Goal status: INITIAL   2.   Pt will increase BERG balance score to 50/56 to demonstrate improved static balance. Baseline: 45/56 11/29/2021; 55/56 on 12/20/21 Goal status: MET    3.  Pt will be able to go up and down 4 steps with alternating vs. Step to pattern and single handrail with supervision in order to use the stairs going in and out of the house. Baseline:  Goal status: MET   4.  Pt will improve gait speed with LRAD vs. No AD to at least 3.1 ft/sec in order to demo improved community  mobility.  Baseline: 2.88 ft/sec, no AD Goal status: INITIAL   5.  Pt will undergo further assessment of FGA vs. DGI with LTG written in order to demo decr fall risk.      Baseline: 12/15/21: 13/24 scored as baseline Goal status: MET    6.  Pt will ambulate at least 200' over outdoor surfaces with no AD vs. LRAD w/ supervision in order to demo improved community mobility.  Baseline: met with no AD over paved/grass surfaces Goal status: MET   LONG TERM GOALS: Target date: 01/20/2022     Pt will be independent with final HEP in order to build upon functional gains made in therapy. Baseline:  Goal status: INITIAL   2.  Pt will improve DGI to at least a 17/24 in order to demo decr fall risk.  Baseline: 12/02/21: 13/24 scored as baseline Goal status: REVISED   3.  Pt will ambulate at least 500' over outdoor paved/grass surfaces with no AD vs. LRAD with mod I and perform a curb in order to demo improved community mobility. Baseline:  Goal status: INITIAL   4.  Pt will improve gait speed with LRAD vs. No AD to at least 3.4 ft/sec in order to demo improved community mobility.  Baseline: 2.88 ft/sec Goal status: INITIAL   5.  Pt will improve FOTO score to at least 65% in order to demo improved functional outcomes.  Baseline: 46 Goal status: INITIAL     ASSESSMENT:   CLINICAL IMPRESSION: Began to assess pt's STGs today with pt meeting 4 out of 6 STGs.  Will continue to progress towards LTGs. Pt improved BERG score to a 55/56  (previously was 45/56), decr pt's risk of falls. Pt able to ambulate outdoors on pavement/grass with no AD with supervision, but does need CGA when scanning environment and performing head turns. Discussed pt does not need to bring her RW into therapy anymore and pt plans to bring her cane next time. Discussed use of an AD/RW when pt is more fatigued, longer distances outside of unfamiliar environments. Pt verbalized understanding.      OBJECTIVE IMPAIRMENTS Abnormal gait, decreased activity tolerance, decreased balance, decreased coordination, decreased endurance, decreased knowledge of use of DME, decreased strength, and postural dysfunction.    ACTIVITY LIMITATIONS community activity and driving.    PERSONAL FACTORS Behavior pattern, Past/current experiences, Time since onset of injury/illness/exacerbation, and 1-2 comorbidities: HTN, osteoporosis, HLD  are also affecting patient's functional outcome.      REHAB POTENTIAL: Good   CLINICAL DECISION MAKING: Evolving/moderate complexity   EVALUATION COMPLEXITY: Moderate   PLAN: PT FREQUENCY: 2x/week   PT DURATION: 12 weeks   PLANNED INTERVENTIONS: Therapeutic exercises, Therapeutic activity, Neuromuscular re-education, Balance training, Gait training, Patient/Family education, Stair training, Vestibular training, and DME instructions   PLAN FOR NEXT SESSION:  check remainder of STGs.   Monitor BP (check it manually as machine always reads really high for her);   Add to HEP prn-add advanced strengthening/balance w/o UE support. Gait training with no device-continue outside sidewalk/grass/incline.  Continue to address tandem, SLS, reach to floor, turning, toe taps.   Janann August, PT, DPT 12/20/21 1:26 PM   [phone] 12/20/21, 1:26 PM

## 2021-12-23 ENCOUNTER — Ambulatory Visit: Payer: PPO | Admitting: Physical Therapy

## 2021-12-23 ENCOUNTER — Encounter: Payer: Self-pay | Admitting: Physical Therapy

## 2021-12-23 VITALS — BP 140/80

## 2021-12-23 DIAGNOSIS — Z9181 History of falling: Secondary | ICD-10-CM

## 2021-12-23 DIAGNOSIS — R2681 Unsteadiness on feet: Secondary | ICD-10-CM | POA: Diagnosis not present

## 2021-12-23 DIAGNOSIS — I69354 Hemiplegia and hemiparesis following cerebral infarction affecting left non-dominant side: Secondary | ICD-10-CM

## 2021-12-23 DIAGNOSIS — R2689 Other abnormalities of gait and mobility: Secondary | ICD-10-CM

## 2021-12-23 NOTE — Therapy (Signed)
OUTPATIENT PHYSICAL THERAPY TREATMENT NOTE/PROGRESS NOTE   Patient Name: Michelle Jacobson MRN: [id number] DOB:Nov 19, 1945, 76 y.o., female Today's Date: 12/23/2021  PCP: Shon Baton, MD REFERRING PROVIDER: Lavina Hamman, MD (will be followed by Frann Rider, NP)  PT progress note for Lennox Grumbles.  Reporting period 11/25/2021 to 12/23/2021.  See Note below for Objective Data and Assessment of Progress/Goals  Thank you for the referral of this patient. Elease Etienne, PT, DPT   END OF SESSION:   PT End of Session - 12/23/21 1453     Visit Number 9    Number of Visits 17    Date for PT Re-Evaluation 02/23/22    Authorization Type [employer]ICARE    Progress Note Due on Visit 10    PT Start Time 1449    PT Stop Time 1540    PT Time Calculation (min) 51 min    Equipment Utilized During Treatment Gait belt    Activity Tolerance Patient tolerated treatment well    Behavior During Therapy WFL for tasks assessed/performed             Past Medical History:  Diagnosis Date   Hypertension    Osteopenia    History reviewed. No pertinent surgical history. Patient Active Problem List   Diagnosis Date Noted   CVA (cerebral vascular accident) ([place]) 11/18/2021   Hypertensive urgency 11/18/2021   Leukocytosis 11/18/2021   Hyperlipidemia 11/18/2021   Osteopenia     REFERRING DIAG: 11/19/2021 (date of referral)  THERAPY DIAG:  Unsteadiness on feet  Other abnormalities of gait and mobility  History of falling  Hemiplegia and hemiparesis following cerebral infarction affecting left non-dominant side (HCC)  PERTINENT HISTORY: PMH: HTN, osteoporosis .76 year old female with history of hypertension admitted on 11/17/21 for left-sided weakness numbness, dizziness, nausea vomiting and imbalance for several days.  CT no acute abnormality.  MRI showed right pontine infarct.  CTA head and neck bilateral PCA moderate stenosis. She was inpatient in ER, asking for AMA,  did not want to wait for formal PT evaluation. Discharged on 11/19/21  PRECAUTIONS: Fall, has a hx of nausea/motion sickness. (Pt brought in a bucket in case she got nauseous)   SUBJECTIVE: Pt inquires about return to driving (deferred to MD).   PAIN:  Are you having pain? No  PATIENT SURVEYS:  FOTO 46 (predicted 65)  Vitals:   12/23/21 1457  BP: 140/80   TODAY'S TREATMENT: Verbally reviewed HEP.  Pt states they continue to challenge her.  Added below: -monster walks forwards and backwards w/ red theraband 3x10 both directions -backwards walking x20'  Assessed 10MWT x2:  7.67 sec (1st attempt), 5.97 sec (2nd attempt) = 1.68 m/sec OR 5.53 ft/sec  GAIT: Gait pattern: step through pattern, trendelenburg, lateral lean- Right, lateral lean- Left, and narrow BOS Distance walked: 850' outdoors over grass, pavement and gravel Assistive device utilized: SPC and no AD Level of assistance:  supervision, modI  Pt ranges from supervision to modI when on grass especially grassy hill.  Mildly inc instability noted during descent of grassy hill w/o LOB. Pt demonstrating improvements with ambulating on grass. Pt does well without AD on pavement today requiring only intermittent supervision due to inc lateral sway during turning.  Pt ambulates 8' over gravel w/o AD and no LOB close supervision.   PATIENT EDUCATION: Education details: Additions to HEP.  Discussed from prior session:  Pt does not have to use RW when coming in to therapy anymore - plans to bring in her  SPC next time from home, continue using RW/AD for longer outside distances/unfamiliar environments or when pt is more tired.  Person educated: Patient  Education method: Explanation Education comprehension: verbalized understanding     HOME EXERCISE PROGRAM: Access Code: [email or web address] URL: [email or web address] Date: 11/29/2021 Prepared by: Elease Etienne  Exercises - Seated Hamstring Curls with Resistance  - 1 x  daily - 5 x weekly - 2 sets - 10 reps - Supine Bridge  - 1 x daily - 5 x weekly - 2 sets - 10 reps - Seated Knee Extension with Resistance  - 1 x daily - 5 x weekly - 2 sets - 10 reps - Standing Single Leg Stance with Counter Support  - 1 x daily - 5 x weekly - 1 sets - 2 reps - 30 seconds hold - Standing Tandem Balance with Counter Support  - 1 x daily - 5 x weekly - 1 sets - 2 reps - 30 seconds hold - Standing March with Counter Support  - 1 x daily - 5 x weekly - 2 sets - 20 reps  *Pt instructed to use ROM comfortable on R knee for SLS/quad and hamstring exercises.  Statistician Walk with Resistance (BKA)  - 1 x daily - 5 x weekly - 3 sets - 10 reps - Backward Monster Walk with Resistance (BKA)  - 1 x daily - 5 x weekly - 3 sets - 10 reps - Backwards Walking  - 1 x daily - 5 x weekly - 3 sets - 10 reps   GOALS: Goals reviewed with patient? Yes   SHORT TERM GOALS: Target date: 12/23/2021    Pt will be independent with initial HEP in order to build upon functional gains made in therapy. Baseline:  HEP established 11/29/2021 and states they are still challenging. Goal status: MET   2.  Pt will increase BERG balance score to 50/56 to demonstrate improved static balance. Baseline: 45/56 11/29/2021; 55/56 on 12/20/21 Goal status: MET    3.  Pt will be able to go up and down 4 steps with alternating vs. Step to pattern and single handrail with supervision in order to use the stairs going in and out of the house. Baseline:  Goal status: MET   4.  Pt will improve gait speed with LRAD vs. No AD to at least 3.1 ft/sec in order to demo improved community mobility.  Baseline: 2.88 ft/sec, no AD; 5.53 ft/sec no AD Goal status: MET   5.  Pt will undergo further assessment of FGA vs. DGI with LTG written in order to demo decr fall risk.      Baseline: 12/15/21: 13/24 scored as baseline Goal status: MET    6.  Pt will ambulate at least 200' over outdoor surfaces with no AD vs. LRAD w/ supervision  in order to demo improved community mobility.  Baseline: met with no AD over paved/grass surfaces Goal status: MET   LONG TERM GOALS: Target date: 01/20/2022     Pt will be independent with final HEP in order to build upon functional gains made in therapy. Baseline:  Goal status: INITIAL   2.  Pt will improve DGI to at least a 17/24 in order to demo decr fall risk.  Baseline: 12/02/21: 13/24 scored as baseline Goal status: REVISED   3.  Pt will ambulate at least 500' over outdoor paved/grass surfaces with no AD vs. LRAD with mod I and perform a curb in order to demo improved community mobility. Baseline:  Goal status: INITIAL   4.  Pt will improve gait speed with LRAD vs. No AD to at least 3.4 ft/sec in order to demo improved community mobility.  Baseline: 2.88 ft/sec Goal status: Discharged d/t STG progress 12/23/2021   5.  Pt will improve FOTO score to at least 65% in order to demo improved functional outcomes.  Baseline: 46 Goal status: INITIAL     ASSESSMENT:   CLINICAL IMPRESSION: Finished assessing patient's STGs by reviewing and modifying HEP to progress standing balance.  Assessed 10MWT with patient ambulating without AD at speed of 5.53 ft/sec far exceeding goal set.  Discontinued long-term gait speed goal due to speed today exceeding norms.  Per note from last session pt met 4 of 6 STGs improving her BERG score to 55/56 and ambulating 800' without AD on outdoor and indoor surfaces with supervision.  Progressed use of SPC this session with pt ranging from supervision to modI on varying surfaces outdoors.  Will continue to assess best AD option for unlevel surfaces and distances with pt safe to ambulate in familiar environments without AD.    OBJECTIVE IMPAIRMENTS Abnormal gait, decreased activity tolerance, decreased balance, decreased coordination, decreased endurance, decreased knowledge of use of DME, decreased strength, and postural dysfunction.    ACTIVITY LIMITATIONS  community activity and driving.    PERSONAL FACTORS Behavior pattern, Past/current experiences, Time since onset of injury/illness/exacerbation, and 1-2 comorbidities: HTN, osteoporosis, HLD  are also affecting patient's functional outcome.      REHAB POTENTIAL: Good   CLINICAL DECISION MAKING: Evolving/moderate complexity   EVALUATION COMPLEXITY: Moderate   PLAN: PT FREQUENCY: 2x/week   PT DURATION: 12 weeks   PLANNED INTERVENTIONS: Therapeutic exercises, Therapeutic activity, Neuromuscular re-education, Balance training, Gait training, Patient/Family education, Stair training, Vestibular training, and DME instructions   PLAN FOR NEXT SESSION:   Monitor BP (check it manually as machine always reads really high for her);   Add to HEP prn, Gait training with no device-continue outside sidewalk/grass/incline.  Continue to address tandem, SLS, reach to floor, turning, toe taps.   Elease Etienne, PT, DPT 12/23/21 4:20 PM [phone]

## 2021-12-27 ENCOUNTER — Ambulatory Visit: Payer: PPO | Admitting: Physical Therapy

## 2021-12-27 ENCOUNTER — Encounter: Payer: Self-pay | Admitting: Physical Therapy

## 2021-12-27 VITALS — BP 144/78

## 2021-12-27 DIAGNOSIS — R2681 Unsteadiness on feet: Secondary | ICD-10-CM | POA: Diagnosis not present

## 2021-12-27 DIAGNOSIS — Z9181 History of falling: Secondary | ICD-10-CM

## 2021-12-27 DIAGNOSIS — R2689 Other abnormalities of gait and mobility: Secondary | ICD-10-CM

## 2021-12-27 NOTE — Therapy (Signed)
OUTPATIENT PHYSICAL THERAPY TREATMENT NOTE  Patient Name: Michelle Jacobson MRN: [id number] DOB:08-09-1945, 76 y.o., female Today's Date: 12/27/2021  PCP: Shon Baton, MD REFERRING PROVIDER: Lavina Hamman, MD (will be followed by Frann Rider, NP)    END OF SESSION:   PT End of Session - 12/27/21 1320     Visit Number 10    Number of Visits 17    Date for PT Re-Evaluation 02/23/22    Authorization Type [employer]MEDICARE    Progress Note Due on Visit 10    PT Start Time 1319    PT Stop Time 1357    PT Time Calculation (min) 38 min    Equipment Utilized During Treatment Gait belt    Activity Tolerance Patient tolerated treatment well    Behavior During Therapy WFL for tasks assessed/performed             Past Medical History:  Diagnosis Date   Hypertension    Osteopenia    History reviewed. No pertinent surgical history. Patient Active Problem List   Diagnosis Date Noted   CVA (cerebral vascular accident) ([place]) 11/18/2021   Hypertensive urgency 11/18/2021   Leukocytosis 11/18/2021   Hyperlipidemia 11/18/2021   Osteopenia     REFERRING DIAG: 11/19/2021 (date of referral)  THERAPY DIAG:  Unsteadiness on feet  Other abnormalities of gait and mobility  History of falling  PERTINENT HISTORY: PMH: HTN, osteoporosis 76 year old female with history of hypertension admitted on 11/17/21 for left-sided weakness numbness, dizziness, nausea vomiting and imbalance for several days.  CT no acute abnormality.  MRI showed right pontine infarct.  CTA head and neck bilateral PCA moderate stenosis. She was inpatient in ER, asking for AMA, did not want to wait for formal PT evaluation. Discharged on 11/19/21  PRECAUTIONS: Fall, has a hx of nausea/motion sickness. (Pt brought in a bucket in case she got nauseous)   SUBJECTIVE: No changes since she was last here. Did her new exercises.   PAIN:  Are you having pain? No  PATIENT SURVEYS:  FOTO 46 (predicted  65)  Vitals:   12/27/21 1325  BP: (!) 144/78    TODAY'S TREATMENT:  [facility] PT Assessment - 12/27/21 1327       Dynamic Gait Index   Level Surface Normal    Change in Gait Speed Normal    Gait with Horizontal Head Turns Mild Impairment    Gait with Vertical Head Turns Mild Impairment    Gait and Pivot Turn Normal    Step Over Obstacle Normal    Step Around Obstacles Normal    Steps Normal    Total Score 22    DGI comment: 22/24      Functional Gait  Assessment   Gait assessed  Yes    Gait Level Surface Walks 20 ft in less than 5.5 sec, no assistive devices, good speed, no evidence for imbalance, normal gait pattern, deviates no more than 6 in outside of the 12 in walkway width.    Change in Gait Speed Able to smoothly change walking speed without loss of balance or gait deviation. Deviate no more than 6 in outside of the 12 in walkway width.    Gait with Horizontal Head Turns Performs head turns smoothly with slight change in gait velocity (eg, minor disruption to smooth gait path), deviates 6-10 in outside 12 in walkway width, or uses an assistive device.    Gait with Vertical Head Turns Performs task with slight change in gait velocity (eg,  minor disruption to smooth gait path), deviates 6 - 10 in outside 12 in walkway width or uses assistive device    Gait and Pivot Turn Pivot turns safely within 3 sec and stops quickly with no loss of balance.    Step Over Obstacle Is able to step over 2 stacked shoe boxes taped together (9 in total height) without changing gait speed. No evidence of imbalance.    Gait with Narrow Base of Support Ambulates 4-7 steps.    Gait with Eyes Closed Walks 20 ft, uses assistive device, slower speed, mild gait deviations, deviates 6-10 in outside 12 in walkway width. Ambulates 20 ft in less than 9 sec but greater than 7 sec.   7.66 seconds   Ambulating Backwards Walks 20 ft, uses assistive device, slower speed, mild gait deviations, deviates 6-10 in outside  12 in walkway width.    Steps Alternating feet, no rail.    Total Score 24    FGA comment: 24/30 = medium fall risk              GAIT: Gait pattern: step through pattern, trendelenburg, lateral lean- Right, lateral lean- Left, and narrow BOS Distance walked: Clinic distances with no AD.  Level of assistance:  supervision, modI   NMR: On blue foam beam -Forward tandem gait down and back 3 reps, holding each tandem position for 5 seconds, intermittent UE support -With wide BOS EO; x10 reps head turns and x10 reps head nods, performed another 5 reps with head turns with feet closer together but pt refusing to continue exercise and wishing to get off.   Standing on level ground with 3 cones in front; with each leg gently tapping toe to each 3 cones without UE support x6 reps each side, intermittent UE support for balance. Cues for glute activation for SLS.     PATIENT EDUCATION: Education details: Using cane for grass/longer outdoor distances. Don't need to bring [facility] in anymore to therapy. Potential plan for D/C at next session due to progress (pt in agreement with this plan). Results of balance tests Person educated: Patient  Education method: Explanation Education comprehension: verbalized understanding     HOME EXERCISE PROGRAM: Access Code: [email or web address] URL: [email or web address] Date: 11/29/2021 Prepared by: Elease Etienne  Exercises - Seated Hamstring Curls with Resistance  - 1 x daily - 5 x weekly - 2 sets - 10 reps - Supine Bridge  - 1 x daily - 5 x weekly - 2 sets - 10 reps - Seated Knee Extension with Resistance  - 1 x daily - 5 x weekly - 2 sets - 10 reps - Standing Single Leg Stance with Counter Support  - 1 x daily - 5 x weekly - 1 sets - 2 reps - 30 seconds hold - Standing Tandem Balance with Counter Support  - 1 x daily - 5 x weekly - 1 sets - 2 reps - 30 seconds hold - Standing March with Counter Support  - 1 x daily - 5 x weekly - 2 sets - 20  reps  *Pt instructed to use ROM comfortable on R knee for SLS/quad and hamstring exercises.  Statistician Walk with Resistance (BKA)  - 1 x daily - 5 x weekly - 3 sets - 10 reps - Backward Monster Walk with Resistance (BKA)  - 1 x daily - 5 x weekly - 3 sets - 10 reps - Backwards Walking  - 1 x daily - 5 x weekly - 3 sets -  10 reps   GOALS: Goals reviewed with patient? Yes   SHORT TERM GOALS: Target date: 12/23/2021    Pt will be independent with initial HEP in order to build upon functional gains made in therapy. Baseline:  HEP established 11/29/2021 and states they are still challenging. Goal status: MET   2.  Pt will increase BERG balance score to 50/56 to demonstrate improved static balance. Baseline: 45/56 11/29/2021; 55/56 on 12/20/21 Goal status: MET    3.  Pt will be able to go up and down 4 steps with alternating vs. Step to pattern and single handrail with supervision in order to use the stairs going in and out of the house. Baseline:  Goal status: MET   4.  Pt will improve gait speed with LRAD vs. No AD to at least 3.1 ft/sec in order to demo improved community mobility.  Baseline: 2.88 ft/sec, no AD; 5.53 ft/sec no AD Goal status: MET   5.  Pt will undergo further assessment of FGA vs. DGI with LTG written in order to demo decr fall risk.      Baseline: 12/15/21: 13/24 scored as baseline Goal status: MET    6.  Pt will ambulate at least 200' over outdoor surfaces with no AD vs. LRAD w/ supervision in order to demo improved community mobility.  Baseline: met with no AD over paved/grass surfaces Goal status: MET   LONG TERM GOALS: Target date: 01/20/2022     Pt will be independent with final HEP in order to build upon functional gains made in therapy. Baseline:  Goal status: INITIAL   2.  Pt will improve DGI to at least a 17/24 in order to demo decr fall risk.  Baseline: 12/02/21: 13/24 scored as baseline; 22/24 on 12/27/21 Goal status: MET    3.  Pt will ambulate at  least 500' over outdoor paved/grass surfaces with no AD vs. LRAD with mod I and perform a curb in order to demo improved community mobility. Baseline:  Goal status: INITIAL   4.  Pt will improve gait speed with LRAD vs. No AD to at least 3.4 ft/sec in order to demo improved community mobility.  Baseline: 2.88 ft/sec Goal status: Discharged d/t STG progress 12/23/2021   5.  Pt will improve FOTO score to at least 65% in order to demo improved functional outcomes.  Baseline: 46 Goal status: INITIAL  6.  Pt will improve FGA to at least a 27/30 in order to demo decr fall risk.  Baseline: 24/30 Goal status: NEW      ASSESSMENT:   CLINICAL IMPRESSION: Performed the DGI today with pt scoring a 22/24, indicating decr fall risk and pt met LTG in regards to this. Performed the FGA as a higher level balance test with pt scoring a 24/30 indicating a medium fall risk. LTG updated. Pt not willing to work on balance on unlevel surfaces today. Discussed due to progress with therapy, that pt can likely D/C at next session. Pt in agreement with this plan.    OBJECTIVE IMPAIRMENTS Abnormal gait, decreased activity tolerance, decreased balance, decreased coordination, decreased endurance, decreased knowledge of use of DME, decreased strength, and postural dysfunction.    ACTIVITY LIMITATIONS community activity and driving.    PERSONAL FACTORS Behavior pattern, Past/current experiences, Time since onset of injury/illness/exacerbation, and 1-2 comorbidities: HTN, osteoporosis, HLD  are also affecting patient's functional outcome.      REHAB POTENTIAL: Good   CLINICAL DECISION MAKING: Evolving/moderate complexity   EVALUATION COMPLEXITY: Moderate  PLAN: PT FREQUENCY: 2x/week   PT DURATION: 12 weeks   PLANNED INTERVENTIONS: Therapeutic exercises, Therapeutic activity, Neuromuscular re-education, Balance training, Gait training, Patient/Family education, Stair training, Vestibular training, and DME  instructions   PLAN FOR NEXT SESSION:     Likely D/C next session due to progress. SLS tasks, eyes closed, tandem, head motions    Dois Juarbe, PT, DPT 12/27/21 1:58 PM   12/27/21 1:58 PM [phone]

## 2021-12-30 ENCOUNTER — Encounter: Payer: Self-pay | Admitting: Adult Health

## 2021-12-30 ENCOUNTER — Ambulatory Visit: Payer: PPO | Admitting: Physical Therapy

## 2021-12-30 ENCOUNTER — Ambulatory Visit: Payer: PPO | Admitting: Adult Health

## 2021-12-30 ENCOUNTER — Encounter: Payer: Self-pay | Admitting: Physical Therapy

## 2021-12-30 VITALS — BP 148/76 | HR 77 | Ht 63.0 in | Wt 164.0 lb

## 2021-12-30 DIAGNOSIS — Z9181 History of falling: Secondary | ICD-10-CM

## 2021-12-30 DIAGNOSIS — R2681 Unsteadiness on feet: Secondary | ICD-10-CM | POA: Diagnosis not present

## 2021-12-30 DIAGNOSIS — I6350 Cerebral infarction due to unspecified occlusion or stenosis of unspecified cerebral artery: Secondary | ICD-10-CM

## 2021-12-30 DIAGNOSIS — E785 Hyperlipidemia, unspecified: Secondary | ICD-10-CM | POA: Diagnosis not present

## 2021-12-30 DIAGNOSIS — R2689 Other abnormalities of gait and mobility: Secondary | ICD-10-CM

## 2021-12-30 DIAGNOSIS — Z09 Encounter for follow-up examination after completed treatment for conditions other than malignant neoplasm: Secondary | ICD-10-CM

## 2021-12-30 NOTE — Patient Instructions (Addendum)
Continue working with PT for hopeful ongoing recovery   Graduated return to driving as recommended.  It is recommended that you first drive with another licensed driver in an empty parking lot. If you do well with this, you can drive on a quiet street with the licensed driver.  If you do well with this, you can drive on a busy street with a licensed driver.  If you continue to do well, you can be cleared to drive independently.  For the first month after resuming driving, it is recommend no nighttime, busy/heavy traffic roads or Interstate driving.   Continue aspirin 81 mg daily  and atorvastatin for secondary stroke prevention - both are recommended life long for secondary stroke prevention  We will check cholesterol levels today  Continue to follow up with PCP regarding cholesterol and blood pressure management  Maintain strict control of hypertension with blood pressure goal below 130/90 and cholesterol with LDL cholesterol (bad cholesterol) goal below 70 mg/dL.   Signs of a Stroke? Follow the BEFAST method:  Balance Watch for a sudden loss of balance, trouble with coordination or vertigo Eyes Is there a sudden loss of vision in one or both eyes? Or double vision?  Face: Ask the person to smile. Does one side of the face droop or is it numb?  Arms: Ask the person to raise both arms. Does one arm drift downward? Is there weakness or numbness of a leg? Speech: Ask the person to repeat a simple phrase. Does the speech sound slurred/strange? Is the person confused ? Time: If you observe any of these signs, call 911.        Thank you for coming to see [place] at [facility]. I hope we have been able to provide you high quality care today.  You may receive a patient satisfaction survey over the next few weeks. We would appreciate your feedback and comments so that we may continue to improve ourselves and the health of our patients.      Eating Plan After Stroke A stroke  causes damage to brain cells, which can affect your ability to walk, talk, and eat. The impact of a stroke is different for everyone, and so is recovery. A good nutrition plan is important for your recovery. It can also lower your risk of another stroke. If you have difficulty chewing and swallowing your food, work with a diet and nutrition specialist (dietitian), or your stroke care team, to make sure that you are eating healthy foods and getting all the nutrients you need. What are tips for following this plan?  Reading food labels Choose foods that have less than 300 milligrams (mg) of salt (sodium) per serving. Limit your sodium intake to less than 1,500 mg per day. Avoid foods that have saturated fat and trans fat. Choose foods that are low in cholesterol. Limit the amount of cholesterol you eat each day to less than 200 mg. Choose foods that are high in fiber. Eat 20-30 grams (g) of fiber each day. Avoid foods with added sugar. Check the food label for ingredients such as sugar, corn syrup, honey, fructose, molasses, and cane juice. Shopping At the grocery store, buy most of your food from areas near the walls of the store. This includes: Fresh fruits and vegetables. Dry grains, beans, nuts, and seeds. Fresh seafood, poultry, lean meats, and eggs. Low-fat dairy products. Buy whole ingredients instead of prepackaged foods. Buy fresh, in-season fruits and vegetables from local farmers markets. Buy frozen  fruits and vegetables in resealable bags. Cooking Prepare foods with very little salt. Use herbs or salt-free spices instead. Cook with heart-healthy oils, such as olive, avocado, canola, soybean, or sunflower oil. Avoid frying foods. Bake, grill, or broil foods instead. Remove visible fat and skin from meat and poultry before eating. Modify food textures as told by your health care provider. Meal planning Eat a variety of colorful fruits and vegetables. Make sure one-half of your plate  is filled with fruits and vegetables at each meal. Eat fruits and vegetables that are high in potassium, such as: Apples, bananas, oranges, and melon. Sweet potatoes, spinach, zucchini, and tomatoes. Eat fish that contain heart-healthy fats (omega-3 fats) at least twice a week. These include salmon, tuna, mackerel, and sardines. Eat plant foods that are high in omega-3 fats, such as flaxseeds and walnuts. Add these to cereals, yogurt, or pasta dishes. Eat several servings of high-fiber foods each day, such as fruits, vegetables, whole grains, and beans. Do not put salt on the table for meals. When eating out at restaurants: Ask the server about low-salt or salt-free food options. Avoid fried foods. Look for menu items that are grilled, steamed, broiled, or roasted. Ask if your food can be prepared without butter. Ask for condiments, such as salad dressings, gravy, or sauces to be served on the side. If you have difficulty swallowing: Choose foods that are softer and easier to chew and swallow. Cut foods into small pieces and chew well before swallowing. Thicken liquids as told by your health care provider or dietitian. Let your health care provider know if your condition does not improve over time. You may need to work with a speech therapist to retrain the muscles that are used for eating. General recommendations Involve your family and friends in your recovery, if possible. It may be helpful to have a slower meal time and to plan meals that include foods everyone in the family can eat. Brush your teeth with fluoride toothpaste twice a day, and floss once a day. Keeping a clean mouth can help you swallow and can also help your appetite. Drink enough water to keep your urine pale yellow. If needed, set reminders or ask your family to help you remember to drink water. If you drink alcohol: Limit how much you use to: 0-1 drink a day for women who are not pregnant. 0-2 drinks a day for  men. Know how much alcohol is in your drink. In the [place] one drink equals one 12 oz bottle of beer (355 mL), one 5 oz glass of wine (148 mL), or one 1 oz glass of hard liquor (44 mL). Summary Following this eating plan can help your stroke recovery and can decrease your risk of another stroke. Limit your salt (sodium) and cholesterol intake. Choose foods that are high in fiber. Let your health care provider know if you have problems with swallowing. You may need to work with a speech therapist. This information is not intended to replace advice given to you by your health care provider. Make sure you discuss any questions you have with your health care provider. Document Revised: 05/30/2020 Document Reviewed: 05/30/2020 Elsevier Patient Education  2022 [place].    Cholesterol Content in Foods Cholesterol is a waxy, fat-like substance that helps to carry fat in the blood. The body needs cholesterol in small amounts, but too much cholesterol can cause damage to the arteries and heart. What foods have cholesterol?  Cholesterol is found in animal-based foods, such  as meat, seafood, and dairy. Generally, low-fat dairy and lean meats have less cholesterol than full-fat dairy and fatty meats. The milligrams of cholesterol per serving (mg per serving) of common cholesterol-containing foods are listed below. Meats and other proteins Egg -- one large whole egg has 186 mg. Veal shank -- 4 oz (113 g) has 141 mg. Lean ground [place] (93% lean) -- 4 oz (113 g) has 118 mg. Fat-trimmed lamb loin -- 4 oz (113 g) has 106 mg. Lean ground beef (90% lean) -- 4 oz (113 g) has 100 mg. Lobster -- 3.5 oz (99 g) has 90 mg. Pork loin chops -- 4 oz (113 g) has 86 mg. Canned salmon -- 3.5 oz (99 g) has 83 mg. Fat-trimmed beef top loin -- 4 oz (113 g) has 78 mg. Frankfurter -- 1 frank (3.5 oz or 99 g) has 77 mg. Crab -- 3.5 oz (99 g) has 71 mg. Roasted chicken without skin, white meat -- 4 oz (113 g) has 66  mg. Light bologna -- 2 oz (57 g) has 45 mg. Deli-cut [place] -- 2 oz (57 g) has 31 mg. Canned tuna -- 3.5 oz (99 g) has 31 mg. Berniece Salines -- 1 oz (28 g) has 29 mg. Oysters and mussels (raw) -- 3.5 oz (99 g) has 25 mg. Mackerel -- 1 oz (28 g) has 22 mg. Trout -- 1 oz (28 g) has 20 mg. Pork sausage -- 1 link (1 oz or 28 g) has 17 mg. Salmon -- 1 oz (28 g) has 16 mg. Tilapia -- 1 oz (28 g) has 14 mg. Dairy Soft-serve ice cream --  cup (4 oz or 86 g) has 103 mg. Whole-milk yogurt -- 1 cup (8 oz or 245 g) has 29 mg. Cheddar cheese -- 1 oz (28 g) has 28 mg. American cheese -- 1 oz (28 g) has 28 mg. Whole milk -- 1 cup (8 oz or 250 mL) has 23 mg. 2% milk -- 1 cup (8 oz or 250 mL) has 18 mg. Cream cheese -- 1 tablespoon (Tbsp) (14.5 g) has 15 mg. Cottage cheese --  cup (4 oz or 113 g) has 14 mg. Low-fat (1%) milk -- 1 cup (8 oz or 250 mL) has 10 mg. Sour cream -- 1 Tbsp (12 g) has 8.5 mg. Low-fat yogurt -- 1 cup (8 oz or 245 g) has 8 mg. Nonfat Greek yogurt -- 1 cup (8 oz or 228 g) has 7 mg. Half-and-half cream -- 1 Tbsp (15 mL) has 5 mg. Fats and oils Cod liver oil -- 1 tablespoon (Tbsp) (13.6 g) has 82 mg. Butter -- 1 Tbsp (14 g) has 15 mg. Lard -- 1 Tbsp (12.8 g) has 14 mg. Bacon grease -- 1 Tbsp (12.9 g) has 14 mg. Mayonnaise -- 1 Tbsp (13.8 g) has 5-10 mg. Margarine -- 1 Tbsp (14 g) has 3-10 mg. The items listed above may not be a complete list of foods with cholesterol. Exact amounts of cholesterol in these foods may vary depending on specific ingredients and brands. Contact a dietitian for more information. What foods do not have cholesterol? Most plant-based foods do not have cholesterol unless you combine them with a food that has cholesterol. Foods without cholesterol include: Grains and cereals. Vegetables. Fruits. Vegetable oils, such as olive, canola, and sunflower oil. Legumes, such as peas, beans, and lentils. Nuts and seeds. Egg whites. The items listed above may not be  a complete list of foods that do not have cholesterol.  Contact a dietitian for more information. Summary The body needs cholesterol in small amounts, but too much cholesterol can cause damage to the arteries and heart. Cholesterol is found in animal-based foods, such as meat, seafood, and dairy. Generally, low-fat dairy and lean meats have less cholesterol than full-fat dairy and fatty meats. This information is not intended to replace advice given to you by your health care provider. Make sure you discuss any questions you have with your health care provider. Document Revised: 11/13/2020 Document Reviewed: 11/13/2020 Elsevier Patient Education  [place].

## 2021-12-30 NOTE — Progress Notes (Signed)
[facility] [address] [place]. [place] [address] [phone]       HOSPITAL FOLLOW UP NOTE  Ms. Michelle Jacobson Date of Birth:  [birth year] Medical Record Number:  [id number]   Reason for Referral:  hospital stroke follow up    SUBJECTIVE:   CHIEF COMPLAINT:  Chief Complaint  Patient presents with   Follow-up    RM 3 alone Pt is well and stable, no concerns     HPI:   Ms. Michelle Jacobson is a 76 y.o. female with history of hypertension and osteopenia who presented on 11/17/2021 with left-sided weakness/numbness, dizziness, N/V and imbalance for several days with blood pressure noted to be elevated at 207/71.  Evaluated by Dr. 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  CTA head/neck bilateral PCA moderate stenosis.  LE Doppler negative for DVT.  EF 60 to 65%.  LDL 164.  A1c 5.6.  Recommended DAPT for 3 weeks and aspirin alone as well as initiating atorvastatin 80 mg daily.  Blood pressure stabilized, resumed home dose losartan.  No prior stroke history.  On neuro exam, mild LLE weakness although ambulating independently.  PT/OT eval pending but patient requested discharge prior to evaluations and outpatient referral was placed.   Today, 12/30/2021, patient is being seen for initial hospital follow-up unaccompanied.  Has been doing well since discharge, denies new stroke/TIA symptoms.  Plans on completing PT tomorrow for imbalance, does report improvement. Ambulates without AD, no falls. Denies any residual left sided symptoms. Has returned back to majority of prior activities. She questions return to driving.   Completed 3 weeks DAPT, remains on aspirin alone as well as atorvastatin, denies side effects.  Blood pressure today 148/76. Has been trying to modify her diet with avoiding sodium and high cholesterol foods. No further concerns at this time.       PERTINENT  IMAGING/LABS  Per hospitalization 11/17/2021 Code Stroke CT head No acute abnormality.  MRI  Multiple acute infarcts within the right pons measuring up to 8 mm. CTA-minimal atherosclerotic disease, dominant left ACA.  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  Grade 1 diastolic dysfunction. LDL 164 HgbA1c 5.6    ROS:   14 system review of systems performed and negative with exception of those listed in HPI  PMH:  Past Medical History:  Diagnosis Date   Hypertension    Osteopenia     PSH: History reviewed. No pertinent surgical history.  Social History:  Social History   Socioeconomic History   Marital status: Married    Spouse name: Not on file   Number of children: Not on file   Years of education: Not on file   Highest education level: Not on file  Occupational History   Not on file  Tobacco Use   Smoking status: Never   Smokeless tobacco: Not on file  Substance and Sexual Activity   Alcohol use: Never   Drug use: Never   Sexual activity: Not on file  Other Topics Concern   Not on file  Social History Narrative   Not on file   Social Determinants of Health   Financial Resource Strain: Not on file  Food Insecurity: Not on file  Transportation Needs: Not on file  Physical Activity: Not on file  Stress: Not on file  Social Connections: Not on file  Intimate Partner Violence: Not on file    Family History:  Family History  Problem Relation Age of Onset   Stroke Mother        in 54's   Heart disease Father        rheumatic heart disease   Breast cancer Neg Hx     Medications:   Current Outpatient Medications on File Prior to Visit  Medication Sig Dispense Refill   aspirin EC 81 MG EC tablet Take 1 tablet (81 mg total) by mouth daily. Swallow whole. 120 tablet 0   atorvastatin (LIPITOR) 80 MG tablet Take 1 tablet (80 mg  total) by mouth daily. 60 tablet 0   Cetirizine HCl (ZYRTEC ALLERGY PO) Take by mouth daily.     cholecalciferol (VITAMIN D) 1000 UNITS tablet Take 1,000 Units by mouth daily.     dorzolamide-timolol (COSOPT) 22.3-6.8 MG/ML ophthalmic solution Place 1 drop into the left eye 2 (two) times daily.     latanoprost (XALATAN) 0.005 % ophthalmic solution Place 1 drop into both eyes at bedtime.     losartan (COZAAR) 100 MG tablet Take 100 mg by mouth daily.     ondansetron (ZOFRAN) 8 MG tablet Take 8 mg by mouth as needed for nausea or vomiting.     No current facility-administered medications on file prior to visit.    Allergies:  No Known Allergies    OBJECTIVE:  Physical Exam  Vitals:   12/30/21 1015  BP: (!) 148/76  Pulse: 77  Weight: 164 lb (74.4 kg)  Height: '5\' 3"'$  (1.6 m)   Body mass index is 29.05 kg/m. No results found.  Post stroke [facility] 2/9    12/30/2021   10:20 AM  Depression screen PHQ 2/9  Decreased Interest 0  Down, Depressed, Hopeless 0  PHQ - 2 Score 0     General: well developed, well nourished, pleasant elderly caucasian female, seated, in no evident distress Head: head normocephalic and atraumatic.   Neck: supple with no carotid or supraclavicular bruits Cardiovascular: regular rate and rhythm, no murmurs Musculoskeletal: no deformity Skin:  no rash/petichiae Vascular:  Normal pulses all extremities   Neurologic Exam Mental Status: Awake and fully alert. Fluent speech and language.  Oriented to place and time. Recent and remote memory intact. Attention span, concentration and fund of knowledge appropriate. Mood and affect appropriate.  Cranial Nerves: Fundoscopic exam reveals sharp disc margins. Pupils equal, briskly reactive to light. Extraocular movements full without nystagmus. Visual fields full to confrontation. Hearing intact. Facial sensation intact. Face, tongue, palate moves normally and symmetrically.  Motor: Normal bulk and tone. Normal strength  in all tested extremity muscles except slight decreased left hand dexterity  Sensory.: intact to touch , pinprick , position and vibratory sensation.  Coordination: Rapid alternating movements normal in all extremities except slightly decreased left hand. Finger-to-nose and heel-to-shin performed accurately bilaterally. Gait and Station: Arises from chair without difficulty. Stance is normal. Gait demonstrates normal stride length and mild imbalance without use of AD. Mild difficulty performing tandem walk and heel toe Reflexes: 1+ and symmetric. Toes downgoing.     NIHSS  0 Modified Rankin  2      ASSESSMENT: Michelle Jacobson is a 76 y.o. 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 Vascular risk factors include HTN, HLD, intracranial stenosis and advanced age.      PLAN:  Right pons  stroke:  Residual deficit: imbalance - continue therapies. Cleared to return back to driving with graduated return to driving basis with further instructions on AVS Continue aspirin 81 mg daily  and atorvastatin 80 mg daily for secondary stroke prevention.   Discussed secondary stroke prevention measures and importance of close PCP follow up for aggressive stroke risk factor management including BP goal<130/90, HLD with LDL goal<70 and DM with A1c.<7.  Stroke labs 11/2021: LDL 164, A1c 5.6 - repeat lipid panel today I have gone over the pathophysiology of stroke, warning signs and symptoms, risk factors and their management in some detail with instructions to go to the closest emergency room for symptoms of concern.    Doing well from stroke standpoint without further recommendations and risk factors are managed by PCP. She may follow up PRN, as usual for our patients who are strictly being followed for stroke. If any new neurological issues should arise, request PCP place referral for evaluation by one of our neurologists.  Thank you.     CC:  [facility] provider: Dr. Leonie Man PCP: Shon Baton, MD    I spent 59 minutes of face-to-face and non-face-to-face time with patient.  This included previsit chart review including review of recent hospitalization, lab review, study review, order entry, electronic health record documentation, patient education regarding recent stroke including etiology, secondary stroke prevention measures and importance of managing stroke risk factors, residual deficits and typical recovery time and answered all other questions to patient satisfaction   Frann Rider, AGNP-BC  [facility] [address] [place], [place] [address]  Phone [phone] Fax [phone] Note: This document was prepared with digital dictation and possible smart phrase technology. Any transcriptional errors that result from this process are unintentional.

## 2021-12-30 NOTE — Therapy (Signed)
OUTPATIENT PHYSICAL THERAPY TREATMENT NOTE/DISCHARGE SUMMARY  Patient Name: Michelle Jacobson MRN: [id number] DOB:24-Nov-1945, 76 y.o., female 69 Date: 12/30/2021  PCP: Shon Baton, MD REFERRING PROVIDER: Lavina Hamman, MD (will be followed by Frann Rider, NP)    END OF SESSION:   PT End of Session - 12/30/21 1150     Visit Number 11    Number of Visits 17    Date for PT Re-Evaluation 02/23/22    Authorization Type [employer]ICARE    Progress Note Due on Visit 10    PT Start Time 1147    PT Stop Time 1217   full time not used to D/C visit   PT Time Calculation (min) 30 min    Equipment Utilized During Treatment Gait belt    Activity Tolerance Patient tolerated treatment well    Behavior During Therapy WFL for tasks assessed/performed             Past Medical History:  Diagnosis Date   Hypertension    Osteopenia    History reviewed. No pertinent surgical history. Patient Active Problem List   Diagnosis Date Noted   CVA (cerebral vascular accident) ([place]) 11/18/2021   Hypertensive urgency 11/18/2021   Leukocytosis 11/18/2021   Hyperlipidemia 11/18/2021   Osteopenia     REFERRING DIAG: 11/19/2021 (date of referral)  THERAPY DIAG:  Unsteadiness on feet  Other abnormalities of gait and mobility  History of falling  PERTINENT HISTORY: PMH: HTN, osteoporosis .76 year old female with history of hypertension admitted on 11/17/21 for left-sided weakness numbness, dizziness, nausea vomiting and imbalance for several days.  CT no acute abnormality.  MRI showed right pontine infarct.  CTA head and neck bilateral PCA moderate stenosis. She was inpatient in ER, asking for AMA, did not want to wait for formal PT evaluation. Discharged on 11/19/21  PRECAUTIONS: Fall, has a hx of nausea/motion sickness. (Pt brought in a bucket in case she got nauseous)   SUBJECTIVE: Saw the neurologist before session today. Got the clear for graduated return to driving. Went  to a swim meet on Tuesday, had her cane and stepped in a hole and did not fall.   PAIN:  Are you having pain? No   There were no vitals filed for this visit.   TODAY'S TREATMENT:   PATIENT SURVEYS:  FOTO 46 (predicted 65) 81.98% on 12/30/21   GAIT: Gait pattern: step through pattern, trendelenburg, lateral lean- Right, lateral lean- Left, and narrow BOS Distance walked: 1,000' outdoors over unlevel surfaces over paved and grass surfaces Level of assistance:  Mod I  Pt performed curb with mod I and no LOB when ambulating outdoors.        PATIENT EDUCATION: Education details: Results of goals, continue with HEP, D/C from PT  Person educated: Patient  Education method: Explanation Education comprehension: verbalized understanding     HOME EXERCISE PROGRAM:  Reviewed final HEP for D/C on 12/30/21, progressed strengthening exercises to use of blue band, progressed static tandem to tandem gait. Provided updated handout   Access Code: [email or web address] URL: [email or web address] Date: 12/30/2021 Prepared by: Janann August  Exercises - Seated Hamstring Curls with Resistance  - 1 x daily - 5 x weekly - 2 sets - 10 reps - Supine Bridge  - 1 x daily - 5 x weekly - 2 sets - 10 reps - Seated Knee Extension with Resistance  - 1 x daily - 5 x weekly - 2 sets - 10 reps - Standing Single Leg Stance with Counter Support  -  1 x daily - 5 x weekly - 1 sets - 2 reps - 30 seconds hold - Standing March with Counter Support  - 1 x daily - 5 x weekly - 2 sets - 20 reps - Forward Monster Walk with Resistance (BKA)  - 1 x daily - 5 x weekly - 3 sets - 10 reps - Backward Monster Walk with Resistance (BKA)  - 1 x daily - 5 x weekly - 3 sets - 10 reps - Backwards Walking  - 1 x daily - 5 x weekly - 3 sets - 10 reps - Tandem Walking with Counter Support  - 1 x daily - 5 x weekly - 3 sets   PHYSICAL THERAPY DISCHARGE SUMMARY  Visits from Start of Care: 11  Current functional level  related to goals / functional outcomes: See LTGs.   Remaining deficits: Impaired high level balance.   Education / Equipment: HEP   Patient agrees to discharge. Patient goals were met. Patient is being discharged due to meeting the stated rehab goals.   GOALS: Goals reviewed with patient? Yes   SHORT TERM GOALS: Target date: 12/23/2021    Pt will be independent with initial HEP in order to build upon functional gains made in therapy. Baseline:  HEP established 11/29/2021 and states they are still challenging. Goal status: MET   2.  Pt will increase BERG balance score to 50/56 to demonstrate improved static balance. Baseline: 45/56 11/29/2021; 55/56 on 12/20/21 Goal status: MET    3.  Pt will be able to go up and down 4 steps with alternating vs. Step to pattern and single handrail with supervision in order to use the stairs going in and out of the house. Baseline:  Goal status: MET   4.  Pt will improve gait speed with LRAD vs. No AD to at least 3.1 ft/sec in order to demo improved community mobility.  Baseline: 2.88 ft/sec, no AD; 5.53 ft/sec no AD Goal status: MET   5.  Pt will undergo further assessment of FGA vs. DGI with LTG written in order to demo decr fall risk.      Baseline: 12/15/21: 13/24 scored as baseline Goal status: MET    6.  Pt will ambulate at least 200' over outdoor surfaces with no AD vs. LRAD w/ supervision in order to demo improved community mobility.  Baseline: met with no AD over paved/grass surfaces Goal status: MET   LONG TERM GOALS: Target date: 01/20/2022     Pt will be independent with final HEP in order to build upon functional gains made in therapy. Baseline:  Goal status: MET   2.  Pt will improve DGI to at least a 17/24 in order to demo decr fall risk.  Baseline: 12/02/21: 13/24 scored as baseline; 22/24 on 12/27/21 Goal status: MET    3.  Pt will ambulate at least 500' over outdoor paved/grass surfaces with no AD vs. LRAD with mod I and  perform a curb in order to demo improved community mobility. Baseline: 1,000' outdoors with mod I  Goal status: MET   4.  Pt will improve gait speed with LRAD vs. No AD to at least 3.4 ft/sec in order to demo improved community mobility.  Baseline: 2.88 ft/sec; 5.53 ft/sec no AD Goal status: MET   5.  Pt will improve FOTO score to at least 65% in order to demo improved functional outcomes.  Baseline: 46; 81.98 on 12/30/21 Goal status: MET   6.  Pt will  improve FGA to at least a 27/30 in order to demo decr fall risk.  Baseline: 24/30 Goal status: NEW      ASSESSMENT:   CLINICAL IMPRESSION: Pt has met all LTGs and is pleased with her progress in therapy. Pt can ambulate 1,000' outdoors with no AD with mod I over grass/pavement. Discussed if unfamiliar environment outdoors then still would be beneficial to bring cane. Assessed FGA at last visit with pt scoring a 24/30, indicating a medium fall risk. Did not assess again today. Reviewed pt's final HEP with pt to continue to perform for continued gains. Pt will be discharged from PT at this time and has made excellent progress with pt in agreement with plan.    OBJECTIVE IMPAIRMENTS Abnormal gait, decreased activity tolerance, decreased balance, decreased coordination, decreased endurance, decreased knowledge of use of DME, decreased strength, and postural dysfunction.    ACTIVITY LIMITATIONS community activity and driving.    PERSONAL FACTORS Behavior pattern, Past/current experiences, Time since onset of injury/illness/exacerbation, and 1-2 comorbidities: HTN, osteoporosis, HLD  are also affecting patient's functional outcome.      REHAB POTENTIAL: Good   CLINICAL DECISION MAKING: Evolving/moderate complexity   EVALUATION COMPLEXITY: Moderate   PLAN: PT FREQUENCY: 2x/week   PT DURATION: 12 weeks   PLANNED INTERVENTIONS: Therapeutic exercises, Therapeutic activity, Neuromuscular re-education, Balance training, Gait training,  Patient/Family education, Stair training, Vestibular training, and DME instructions   PLAN FOR NEXT SESSION:   D/C   Janann August, PT, DPT 12/30/21 12:25 PM   12/30/21 12:25 PM [phone]

## 2021-12-31 LAB — LIPID PANEL
Chol/HDL Ratio: 3.5 ratio (ref 0.0–4.4)
Cholesterol, Total: 138 mg/dL (ref 100–199)
HDL: 39 mg/dL — ABNORMAL LOW (ref >39)
LDL Chol Calc (NIH): 65 mg/dL (ref 0–99)
Triglycerides: 207 mg/dL — ABNORMAL HIGH (ref 0–149)
VLDL Cholesterol Cal: 34 mg/dL (ref 5–40)

## 2022-01-03 ENCOUNTER — Ambulatory Visit: Payer: PPO

## 2022-01-06 ENCOUNTER — Ambulatory Visit: Payer: PPO | Admitting: Physical Therapy

## 2022-01-10 ENCOUNTER — Ambulatory Visit: Payer: PRIVATE HEALTH INSURANCE | Admitting: Physical Therapy

## 2022-01-13 ENCOUNTER — Ambulatory Visit: Payer: PRIVATE HEALTH INSURANCE | Admitting: Physical Therapy

## 2022-01-17 ENCOUNTER — Ambulatory Visit: Payer: PPO | Admitting: Physical Therapy

## 2022-01-20 ENCOUNTER — Ambulatory Visit: Payer: PRIVATE HEALTH INSURANCE | Admitting: Physical Therapy

## 2022-02-17 DIAGNOSIS — Z1211 Encounter for screening for malignant neoplasm of colon: Secondary | ICD-10-CM | POA: Diagnosis not present

## 2022-02-17 DIAGNOSIS — Z1212 Encounter for screening for malignant neoplasm of rectum: Secondary | ICD-10-CM | POA: Diagnosis not present

## 2022-02-28 DIAGNOSIS — H401111 Primary open-angle glaucoma, right eye, mild stage: Secondary | ICD-10-CM | POA: Diagnosis not present

## 2022-02-28 DIAGNOSIS — H401123 Primary open-angle glaucoma, left eye, severe stage: Secondary | ICD-10-CM | POA: Diagnosis not present

## 2022-02-28 DIAGNOSIS — H47392 Other disorders of optic disc, left eye: Secondary | ICD-10-CM | POA: Diagnosis not present

## 2022-02-28 DIAGNOSIS — H25813 Combined forms of age-related cataract, bilateral: Secondary | ICD-10-CM | POA: Diagnosis not present

## 2022-02-28 DIAGNOSIS — H43811 Vitreous degeneration, right eye: Secondary | ICD-10-CM | POA: Diagnosis not present

## 2022-06-21 DIAGNOSIS — E559 Vitamin D deficiency, unspecified: Secondary | ICD-10-CM | POA: Diagnosis not present

## 2022-06-21 DIAGNOSIS — I10 Essential (primary) hypertension: Secondary | ICD-10-CM | POA: Diagnosis not present

## 2022-06-21 DIAGNOSIS — R5383 Other fatigue: Secondary | ICD-10-CM | POA: Diagnosis not present

## 2022-06-21 DIAGNOSIS — E785 Hyperlipidemia, unspecified: Secondary | ICD-10-CM | POA: Diagnosis not present

## 2022-06-27 DIAGNOSIS — Z0000 Encounter for general adult medical examination without abnormal findings: Secondary | ICD-10-CM | POA: Diagnosis not present

## 2022-06-27 DIAGNOSIS — I10 Essential (primary) hypertension: Secondary | ICD-10-CM | POA: Diagnosis not present

## 2022-06-27 DIAGNOSIS — I6990 Unspecified sequelae of unspecified cerebrovascular disease: Secondary | ICD-10-CM | POA: Diagnosis not present

## 2022-06-27 DIAGNOSIS — G4700 Insomnia, unspecified: Secondary | ICD-10-CM | POA: Diagnosis not present

## 2022-06-27 DIAGNOSIS — M810 Age-related osteoporosis without current pathological fracture: Secondary | ICD-10-CM | POA: Diagnosis not present

## 2022-06-27 DIAGNOSIS — Z23 Encounter for immunization: Secondary | ICD-10-CM | POA: Diagnosis not present

## 2022-06-27 DIAGNOSIS — E559 Vitamin D deficiency, unspecified: Secondary | ICD-10-CM | POA: Diagnosis not present

## 2022-06-27 DIAGNOSIS — E785 Hyperlipidemia, unspecified: Secondary | ICD-10-CM | POA: Diagnosis not present

## 2022-06-27 DIAGNOSIS — R531 Weakness: Secondary | ICD-10-CM | POA: Diagnosis not present

## 2022-06-27 DIAGNOSIS — Z1331 Encounter for screening for depression: Secondary | ICD-10-CM | POA: Diagnosis not present

## 2022-06-27 DIAGNOSIS — E663 Overweight: Secondary | ICD-10-CM | POA: Diagnosis not present

## 2022-07-06 DIAGNOSIS — H401111 Primary open-angle glaucoma, right eye, mild stage: Secondary | ICD-10-CM | POA: Diagnosis not present

## 2022-07-06 DIAGNOSIS — H47392 Other disorders of optic disc, left eye: Secondary | ICD-10-CM | POA: Diagnosis not present

## 2022-07-06 DIAGNOSIS — H401123 Primary open-angle glaucoma, left eye, severe stage: Secondary | ICD-10-CM | POA: Diagnosis not present

## 2022-07-06 DIAGNOSIS — H43811 Vitreous degeneration, right eye: Secondary | ICD-10-CM | POA: Diagnosis not present

## 2022-08-12 ENCOUNTER — Other Ambulatory Visit: Payer: Self-pay | Admitting: Internal Medicine

## 2022-08-12 DIAGNOSIS — Z1231 Encounter for screening mammogram for malignant neoplasm of breast: Secondary | ICD-10-CM

## 2022-10-04 ENCOUNTER — Ambulatory Visit
Admission: RE | Admit: 2022-10-04 | Discharge: 2022-10-04 | Disposition: A | Discharge status: Home / Self Care | Payer: PPO | Source: Ambulatory Visit | Attending: Internal Medicine | Admitting: Internal Medicine

## 2022-10-04 DIAGNOSIS — Z1231 Encounter for screening mammogram for malignant neoplasm of breast: Secondary | ICD-10-CM | POA: Diagnosis not present

## 2022-10-21 ENCOUNTER — Other Ambulatory Visit (INDEPENDENT_AMBULATORY_CARE_PROVIDER_SITE_OTHER): Payer: PPO

## 2022-10-21 ENCOUNTER — Ambulatory Visit: Payer: PPO | Admitting: Orthopaedic Surgery

## 2022-10-21 DIAGNOSIS — M25561 Pain in right knee: Secondary | ICD-10-CM | POA: Diagnosis not present

## 2022-10-21 DIAGNOSIS — G8929 Other chronic pain: Secondary | ICD-10-CM

## 2022-10-21 NOTE — Progress Notes (Signed)
Office Visit Note   Patient: Michelle Jacobson           Date of Birth: 1945/11/26           MRN: [id number] Visit Date: 10/21/2022              Requested by: Creola Corn, MD [address] [place],  [place] [address] PCP: Creola Corn, MD   Assessment & Plan: Visit Diagnoses:  1. Chronic pain of right knee     Plan: Impression 77 year old female with right knee osteoarthritis.  Her main complaint is giving way.  I recommend some strengthening exercises and continue to use the neoprene brace during activity.  Home exercises provided.  Patient currently is not interested in cortisone injections or medications.  Follow-Up Instructions: No follow-ups on file.   Orders:  Orders Placed This Encounter  Procedures   XR KNEE 3 VIEW RIGHT   No orders of the defined types were placed in this encounter.     Procedures: No procedures performed   Clinical Data: No additional findings.   Subjective: Chief Complaint  Patient presents with   Right Knee - Pain    HPI  Michelle Jacobson is a 77 year old female comes in for chronic right knee pain for a year.  Main complaint is giving way.  She wears a neoprene sleeve during activity.  She is status post a stroke from which she is completely recovered from.  She reports lateral pain to the right knee.  Review of Systems  Constitutional: Negative.   HENT: Negative.    Eyes: Negative.   Respiratory: Negative.    Cardiovascular: Negative.   Endocrine: Negative.   Musculoskeletal: Negative.   Neurological: Negative.   Hematological: Negative.   Psychiatric/Behavioral: Negative.    All other systems reviewed and are negative.    Objective: Vital Signs: There were no vitals taken for this visit.  Physical Exam Vitals and nursing note reviewed.  Constitutional:      Appearance: She is well-developed.  HENT:     Head: Atraumatic.     Nose: Nose normal.  Eyes:     Extraocular Movements: Extraocular movements intact.  Cardiovascular:      Pulses: Normal pulses.  Pulmonary:     Effort: Pulmonary effort is normal.  Abdominal:     Palpations: Abdomen is soft.  Musculoskeletal:     Cervical back: Neck supple.  Skin:    General: Skin is warm.     Capillary Refill: Capillary refill takes less than 2 seconds.  Neurological:     Mental Status: She is alert. Mental status is at baseline.  Psychiatric:        Behavior: Behavior normal.        Thought Content: Thought content normal.        Judgment: Judgment normal.     Ortho Exam  Examination right knee shows no joint effusion.  No joint line tenderness.  Minimal crepitus with range of motion.  Normal range of motion.  Collaterals and cruciates are stable.  Specialty Comments:  No specialty comments available.  Imaging: XR KNEE 3 VIEW RIGHT  Result Date: 10/21/2022 Three-view x-rays show age-appropriate mild osteoarthritis.    PMFS History: Patient Active Problem List   Diagnosis Date Noted   CVA (cerebral vascular accident) 11/18/2021   Hypertensive urgency 11/18/2021   Leukocytosis 11/18/2021   Hyperlipidemia 11/18/2021   Osteopenia    Past Medical History:  Diagnosis Date   Hypertension    Osteopenia  Family History  Problem Relation Age of Onset   Stroke Mother        in 8580's   Heart disease Father        rheumatic heart disease   Breast cancer Neg Hx     No past surgical history on file. Social History   Occupational History   Not on file  Tobacco Use   Smoking status: Never   Smokeless tobacco: Not on file  Substance and Sexual Activity   Alcohol use: Never   Drug use: Never   Sexual activity: Not on file

## 2022-11-15 DIAGNOSIS — H401123 Primary open-angle glaucoma, left eye, severe stage: Secondary | ICD-10-CM | POA: Diagnosis not present

## 2022-11-15 DIAGNOSIS — H25813 Combined forms of age-related cataract, bilateral: Secondary | ICD-10-CM | POA: Diagnosis not present

## 2022-11-15 DIAGNOSIS — H47392 Other disorders of optic disc, left eye: Secondary | ICD-10-CM | POA: Diagnosis not present

## 2022-11-15 DIAGNOSIS — H43811 Vitreous degeneration, right eye: Secondary | ICD-10-CM | POA: Diagnosis not present

## 2022-11-15 DIAGNOSIS — H401111 Primary open-angle glaucoma, right eye, mild stage: Secondary | ICD-10-CM | POA: Diagnosis not present

## 2022-12-05 DIAGNOSIS — H401123 Primary open-angle glaucoma, left eye, severe stage: Secondary | ICD-10-CM | POA: Diagnosis not present

## 2023-01-03 DIAGNOSIS — G4700 Insomnia, unspecified: Secondary | ICD-10-CM | POA: Diagnosis not present

## 2023-01-03 DIAGNOSIS — M1990 Unspecified osteoarthritis, unspecified site: Secondary | ICD-10-CM | POA: Diagnosis not present

## 2023-01-03 DIAGNOSIS — R5383 Other fatigue: Secondary | ICD-10-CM | POA: Diagnosis not present

## 2023-01-03 DIAGNOSIS — R531 Weakness: Secondary | ICD-10-CM | POA: Diagnosis not present

## 2023-01-03 DIAGNOSIS — E559 Vitamin D deficiency, unspecified: Secondary | ICD-10-CM | POA: Diagnosis not present

## 2023-01-03 DIAGNOSIS — M810 Age-related osteoporosis without current pathological fracture: Secondary | ICD-10-CM | POA: Diagnosis not present

## 2023-01-03 DIAGNOSIS — I6990 Unspecified sequelae of unspecified cerebrovascular disease: Secondary | ICD-10-CM | POA: Diagnosis not present

## 2023-01-03 DIAGNOSIS — I10 Essential (primary) hypertension: Secondary | ICD-10-CM | POA: Diagnosis not present

## 2023-01-03 DIAGNOSIS — R200 Anesthesia of skin: Secondary | ICD-10-CM | POA: Diagnosis not present

## 2023-01-03 DIAGNOSIS — E785 Hyperlipidemia, unspecified: Secondary | ICD-10-CM | POA: Diagnosis not present

## 2023-01-03 DIAGNOSIS — R269 Unspecified abnormalities of gait and mobility: Secondary | ICD-10-CM | POA: Diagnosis not present

## 2023-01-03 DIAGNOSIS — E663 Overweight: Secondary | ICD-10-CM | POA: Diagnosis not present

## 2023-02-14 DIAGNOSIS — H25813 Combined forms of age-related cataract, bilateral: Secondary | ICD-10-CM | POA: Diagnosis not present

## 2023-02-14 DIAGNOSIS — H43811 Vitreous degeneration, right eye: Secondary | ICD-10-CM | POA: Diagnosis not present

## 2023-02-14 DIAGNOSIS — H47392 Other disorders of optic disc, left eye: Secondary | ICD-10-CM | POA: Diagnosis not present

## 2023-02-14 DIAGNOSIS — H401111 Primary open-angle glaucoma, right eye, mild stage: Secondary | ICD-10-CM | POA: Diagnosis not present

## 2023-02-14 DIAGNOSIS — H401123 Primary open-angle glaucoma, left eye, severe stage: Secondary | ICD-10-CM | POA: Diagnosis not present

## 2023-06-20 DIAGNOSIS — H401111 Primary open-angle glaucoma, right eye, mild stage: Secondary | ICD-10-CM | POA: Diagnosis not present

## 2023-06-20 DIAGNOSIS — H43811 Vitreous degeneration, right eye: Secondary | ICD-10-CM | POA: Diagnosis not present

## 2023-06-20 DIAGNOSIS — H47392 Other disorders of optic disc, left eye: Secondary | ICD-10-CM | POA: Diagnosis not present

## 2023-06-20 DIAGNOSIS — H25813 Combined forms of age-related cataract, bilateral: Secondary | ICD-10-CM | POA: Diagnosis not present

## 2023-06-20 DIAGNOSIS — H401123 Primary open-angle glaucoma, left eye, severe stage: Secondary | ICD-10-CM | POA: Diagnosis not present

## 2023-06-27 DIAGNOSIS — E785 Hyperlipidemia, unspecified: Secondary | ICD-10-CM | POA: Diagnosis not present

## 2023-06-27 DIAGNOSIS — E559 Vitamin D deficiency, unspecified: Secondary | ICD-10-CM | POA: Diagnosis not present

## 2023-06-27 DIAGNOSIS — I10 Essential (primary) hypertension: Secondary | ICD-10-CM | POA: Diagnosis not present

## 2023-07-04 DIAGNOSIS — M810 Age-related osteoporosis without current pathological fracture: Secondary | ICD-10-CM | POA: Diagnosis not present

## 2023-07-04 DIAGNOSIS — M1990 Unspecified osteoarthritis, unspecified site: Secondary | ICD-10-CM | POA: Diagnosis not present

## 2023-07-04 DIAGNOSIS — E785 Hyperlipidemia, unspecified: Secondary | ICD-10-CM | POA: Diagnosis not present

## 2023-07-04 DIAGNOSIS — G4700 Insomnia, unspecified: Secondary | ICD-10-CM | POA: Diagnosis not present

## 2023-07-04 DIAGNOSIS — I6990 Unspecified sequelae of unspecified cerebrovascular disease: Secondary | ICD-10-CM | POA: Diagnosis not present

## 2023-07-04 DIAGNOSIS — Z1339 Encounter for screening examination for other mental health and behavioral disorders: Secondary | ICD-10-CM | POA: Diagnosis not present

## 2023-07-04 DIAGNOSIS — Z0000 Encounter for general adult medical examination without abnormal findings: Secondary | ICD-10-CM | POA: Diagnosis not present

## 2023-07-04 DIAGNOSIS — R490 Dysphonia: Secondary | ICD-10-CM | POA: Diagnosis not present

## 2023-07-04 DIAGNOSIS — E559 Vitamin D deficiency, unspecified: Secondary | ICD-10-CM | POA: Diagnosis not present

## 2023-07-04 DIAGNOSIS — Z1331 Encounter for screening for depression: Secondary | ICD-10-CM | POA: Diagnosis not present

## 2023-07-04 DIAGNOSIS — I10 Essential (primary) hypertension: Secondary | ICD-10-CM | POA: Diagnosis not present

## 2023-07-04 DIAGNOSIS — E663 Overweight: Secondary | ICD-10-CM | POA: Diagnosis not present

## 2023-10-17 DIAGNOSIS — H401123 Primary open-angle glaucoma, left eye, severe stage: Secondary | ICD-10-CM | POA: Diagnosis not present

## 2023-10-17 DIAGNOSIS — H47392 Other disorders of optic disc, left eye: Secondary | ICD-10-CM | POA: Diagnosis not present

## 2023-10-17 DIAGNOSIS — H25813 Combined forms of age-related cataract, bilateral: Secondary | ICD-10-CM | POA: Diagnosis not present

## 2023-10-17 DIAGNOSIS — H401111 Primary open-angle glaucoma, right eye, mild stage: Secondary | ICD-10-CM | POA: Diagnosis not present

## 2023-10-17 DIAGNOSIS — H43811 Vitreous degeneration, right eye: Secondary | ICD-10-CM | POA: Diagnosis not present

## 2024-01-12 DIAGNOSIS — R5383 Other fatigue: Secondary | ICD-10-CM | POA: Diagnosis not present

## 2024-01-12 DIAGNOSIS — E663 Overweight: Secondary | ICD-10-CM | POA: Diagnosis not present

## 2024-01-12 DIAGNOSIS — I10 Essential (primary) hypertension: Secondary | ICD-10-CM | POA: Diagnosis not present

## 2024-01-12 DIAGNOSIS — M810 Age-related osteoporosis without current pathological fracture: Secondary | ICD-10-CM | POA: Diagnosis not present

## 2024-01-12 DIAGNOSIS — R490 Dysphonia: Secondary | ICD-10-CM | POA: Diagnosis not present

## 2024-01-12 DIAGNOSIS — G4700 Insomnia, unspecified: Secondary | ICD-10-CM | POA: Diagnosis not present

## 2024-01-12 DIAGNOSIS — I6990 Unspecified sequelae of unspecified cerebrovascular disease: Secondary | ICD-10-CM | POA: Diagnosis not present

## 2024-01-12 DIAGNOSIS — E559 Vitamin D deficiency, unspecified: Secondary | ICD-10-CM | POA: Diagnosis not present

## 2024-01-12 DIAGNOSIS — M1990 Unspecified osteoarthritis, unspecified site: Secondary | ICD-10-CM | POA: Diagnosis not present

## 2024-01-12 DIAGNOSIS — E785 Hyperlipidemia, unspecified: Secondary | ICD-10-CM | POA: Diagnosis not present

## 2024-02-14 DIAGNOSIS — H401111 Primary open-angle glaucoma, right eye, mild stage: Secondary | ICD-10-CM | POA: Diagnosis not present

## 2024-02-14 DIAGNOSIS — H43811 Vitreous degeneration, right eye: Secondary | ICD-10-CM | POA: Diagnosis not present

## 2024-02-14 DIAGNOSIS — H47392 Other disorders of optic disc, left eye: Secondary | ICD-10-CM | POA: Diagnosis not present

## 2024-02-14 DIAGNOSIS — H25813 Combined forms of age-related cataract, bilateral: Secondary | ICD-10-CM | POA: Diagnosis not present

## 2024-02-14 DIAGNOSIS — H401123 Primary open-angle glaucoma, left eye, severe stage: Secondary | ICD-10-CM | POA: Diagnosis not present

## 2024-04-22 IMAGING — MR MR HEAD W/O CM
9 of 10 series · 36 of 48 positions shown · non-contrast
Comparison: Noncontrast head CT 11/17/2021.

CLINICAL DATA: Provided history: Neuro deficit, acute, stroke
suspected.

EXAM:
MRI HEAD WITHOUT CONTRAST
TECHNIQUE: Multiplanar, multiecho pulse sequences of the brain and surrounding
structures were obtained without intravenous contrast.

[Series 3: DWI · axial · 3.0mm · 1.09mm/px · z∈[-76,+88]mm · 9 of 112 slices shown (1 of 4)]
[im 1/112]
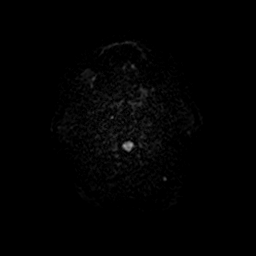
[im 21/112]
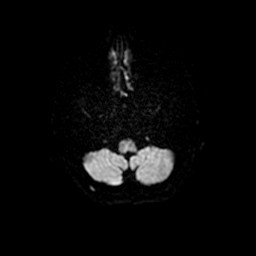
[im 31/112]
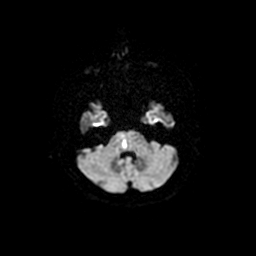
[im 51/112]
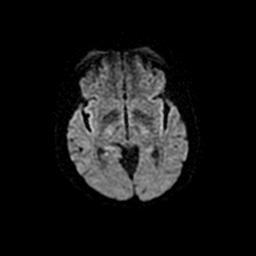
[im 61/112]
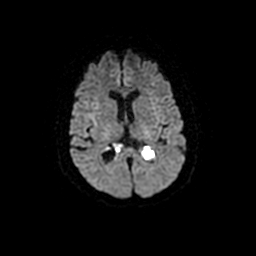
[im 81/112]
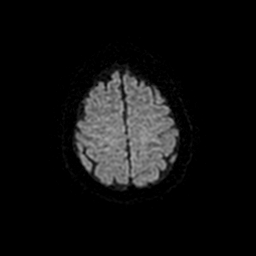
[im 91/112]
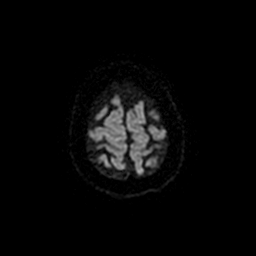
[im 101/112]
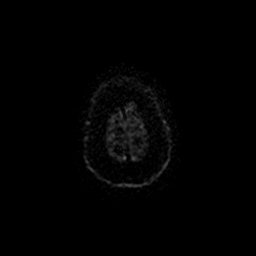
[im 112/112]
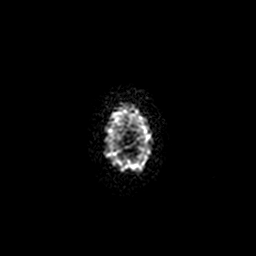

[Series 4: DWI · coronal · 5.0mm · 1.09mm/px · 7 of 76 slices shown (2 of 4)]
[im 1/76]
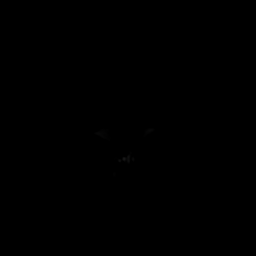
[im 13/76]
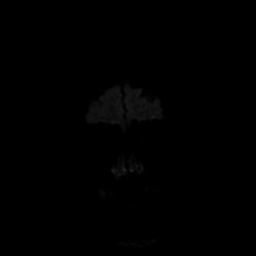
[im 26/76]
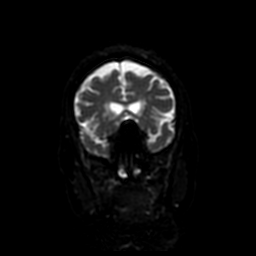
[im 38/76]
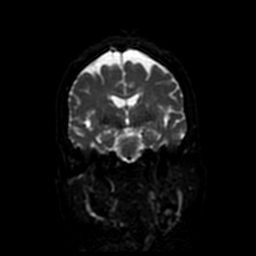
[im 51/76]
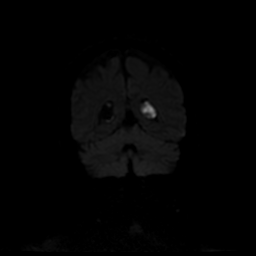
[im 63/76]
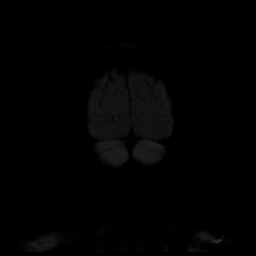
[im 76/76]
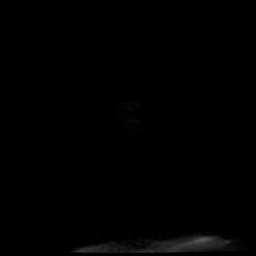

[Series 5: T1 · sagittal · 5.0mm · 0.47mm/px · 2 of 23 slices shown (1 of 2)]
[im 1/23]
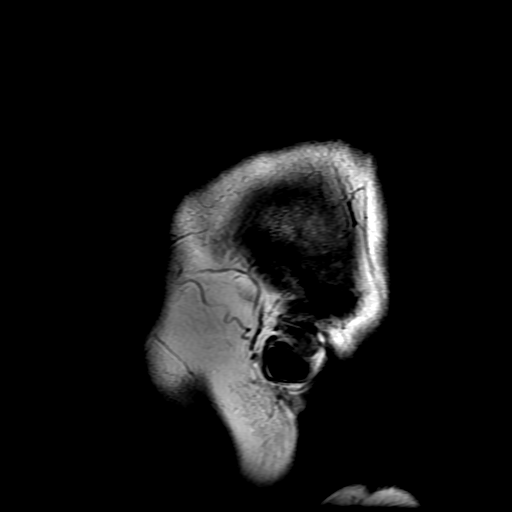
[im 23/23]
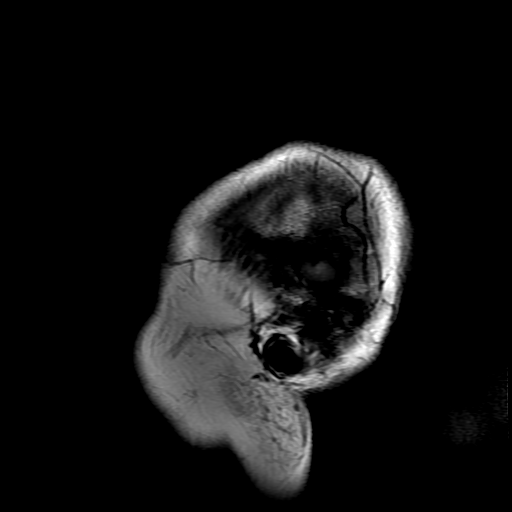

[Series 6: T2 · axial · 5.0mm · 0.43mm/px · z∈[-65,+85]mm · 2 of 26 slices shown (1 of 2)]
[im 1/26]
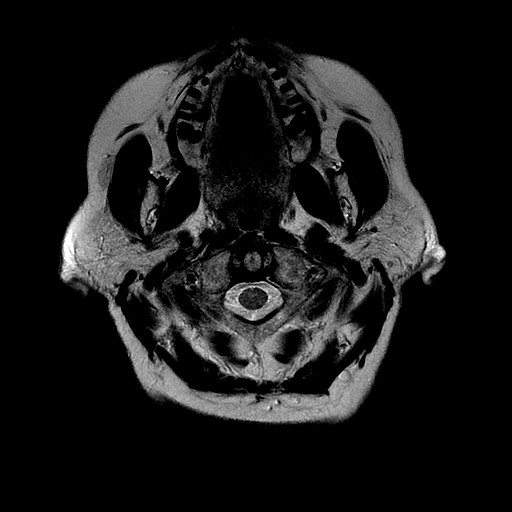
[im 26/26]
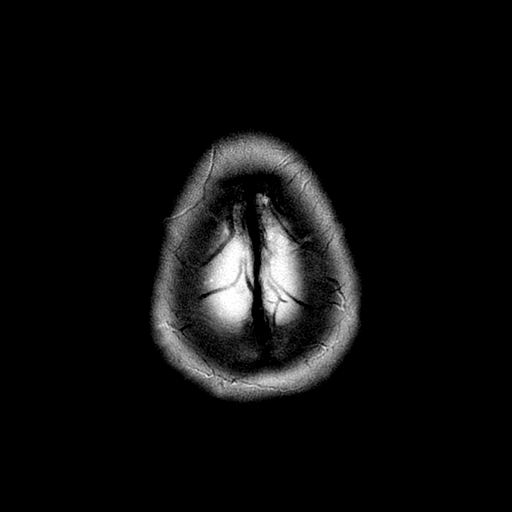

[Series 7: FLAIR · axial · 3.0mm · 0.43mm/px · z∈[-65,+85]mm · 2 of 26 slices shown]
[im 1/26]
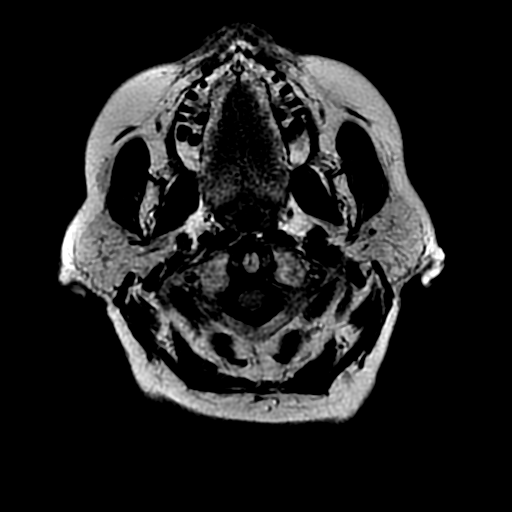
[im 26/26]
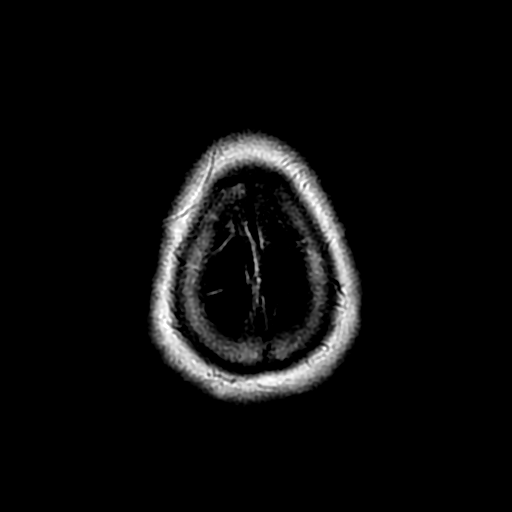

[Series 9: T1 · axial · 3.0mm · 0.43mm/px · z∈[-66,-15]mm · 3 of 104 slices shown (2 of 2)]
[im 1/104]
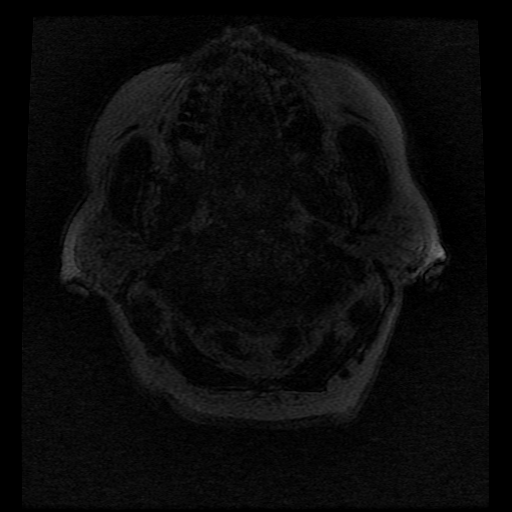
[im 12/104]
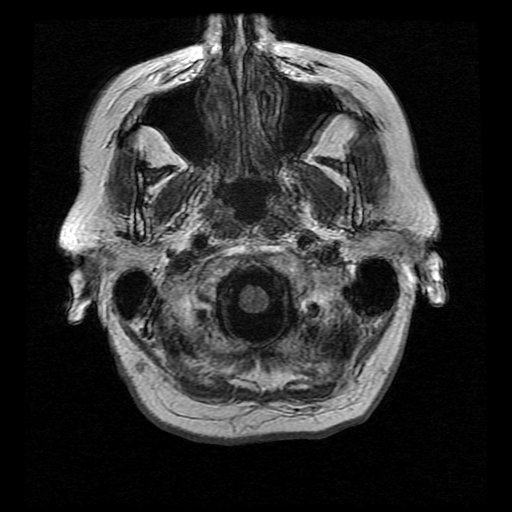
[im 35/104]
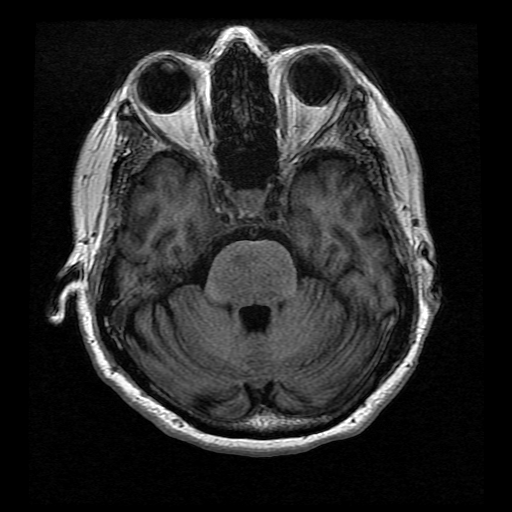

[Series 10: T2 · coronal · 5.0mm · 0.43mm/px · 2 of 24 slices shown (2 of 2)]
[im 1/24]
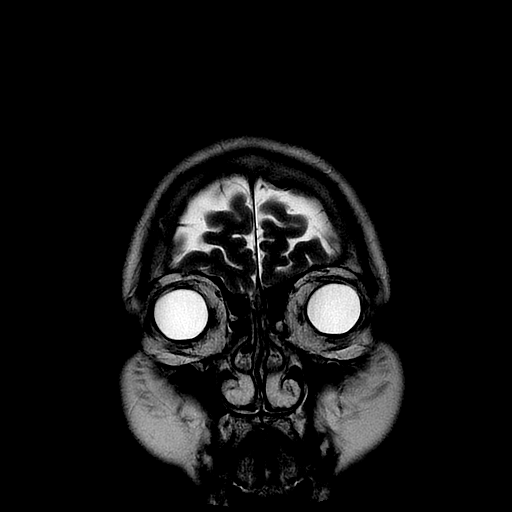
[im 24/24]
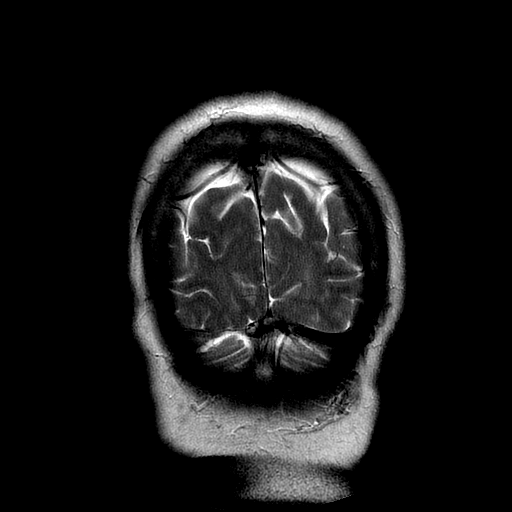

[Series 300: DWI · axial · 3.0mm · 1.09mm/px · z∈[-76,+88]mm · 5 of 56 slices shown (3 of 4)]
[im 1/56]
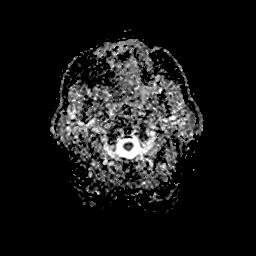
[im 14/56]
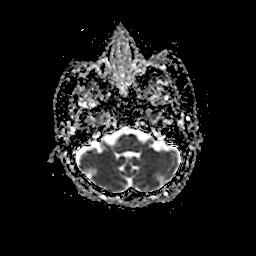
[im 28/56]
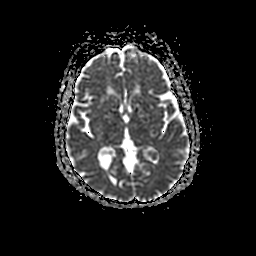
[im 42/56]
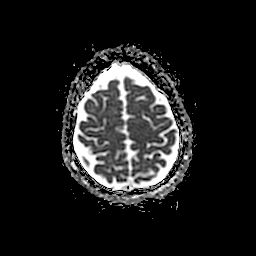
[im 56/56]
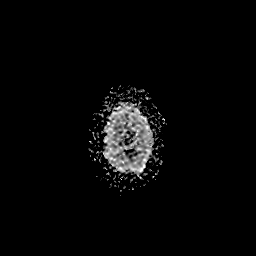

[Series 400: DWI · coronal · 5.0mm · 1.09mm/px · 4 of 38 slices shown (4 of 4)]
[im 1/38]
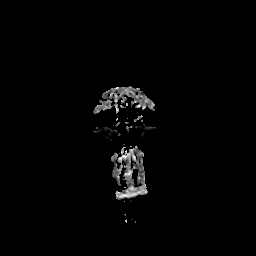
[im 13/38]
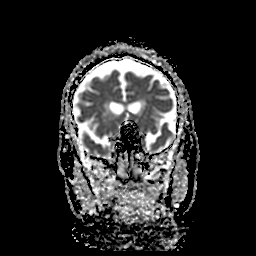
[im 25/38]
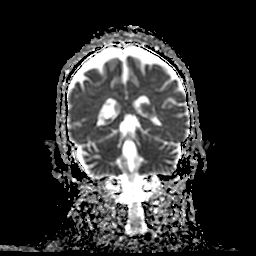
[im 38/38]
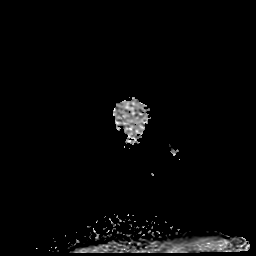

[36 of 48 positions shown; findings below may reference images not displayed]

FINDINGS: Brain:

No age-advanced or lobar predominant parenchymal atrophy.

Multiple acute infarcts within the right pons measuring up to 8 mm.

Mild-to-moderate multifocal T2 FLAIR hyperintense signal abnormality
within the cerebral white matter, nonspecific but compatible with
chronic small vessel ischemic disease.

No evidence of an intracranial mass.

No chronic intracranial blood products.

No extra-axial fluid collection.

No midline shift.

Vascular: Maintained flow voids within the proximal large arterial
vessels.

Skull and upper cervical spine: No focal suspicious marrow lesion.

Sinuses/Orbits: No mass or acute finding within the imaged orbits.
Small mucous retention cyst within the right maxillary sinus. Mild
mucosal thickening within the left maxillary sinus. Mild mucosal
thickening scattered within the bilateral ethmoid air cells.
IMPRESSION: Multiple acute infarcts within the right pons measuring up to 8 mm.

Mild-to-moderate chronic small vessel ischemic changes within the
cerebral white matter.

Mild paranasal sinus disease, as described.

## 2024-06-18 DIAGNOSIS — H25813 Combined forms of age-related cataract, bilateral: Secondary | ICD-10-CM | POA: Diagnosis not present

## 2024-06-18 DIAGNOSIS — H401123 Primary open-angle glaucoma, left eye, severe stage: Secondary | ICD-10-CM | POA: Diagnosis not present

## 2024-06-18 DIAGNOSIS — H401111 Primary open-angle glaucoma, right eye, mild stage: Secondary | ICD-10-CM | POA: Diagnosis not present

## 2024-07-24 NOTE — Progress Notes (Signed)
 Michelle Jacobson                                          MRN: [id number]   07/24/2024   The [facility] Quality Team Specialist reviewed this patient medical record for the purposes of chart review for care gap closure. The following were reviewed: chart review for care gap closure-controlling blood pressure.    [facility] Quality Team
